# Patient Record
Sex: Female | Born: 1960 | Race: Black or African American | Hispanic: No | Marital: Married | Smoking: Never smoker
Health system: Southern US, Community
[De-identification: ages and names within clinical notes are randomized; demographics above are authoritative.]

## PROBLEM LIST (undated history)

## (undated) DIAGNOSIS — IMO0002 Reserved for concepts with insufficient information to code with codable children: Secondary | ICD-10-CM

## (undated) DIAGNOSIS — E119 Type 2 diabetes mellitus without complications: Secondary | ICD-10-CM

## (undated) DIAGNOSIS — D649 Anemia, unspecified: Secondary | ICD-10-CM

## (undated) DIAGNOSIS — K219 Gastro-esophageal reflux disease without esophagitis: Secondary | ICD-10-CM

## (undated) DIAGNOSIS — E7800 Pure hypercholesterolemia, unspecified: Secondary | ICD-10-CM

## (undated) HISTORY — DX: Reserved for concepts with insufficient information to code with codable children: IMO0002

## (undated) HISTORY — DX: Type 2 diabetes mellitus without complications: E11.9

## (undated) HISTORY — PX: ABDOMINAL HYSTERECTOMY: SHX81

## (undated) HISTORY — PX: TUBAL LIGATION: SHX77

---

## 2000-04-05 ENCOUNTER — Other Ambulatory Visit: Admission: RE | Admit: 2000-04-05 | Discharge: 2000-04-05 | Payer: Self-pay | Admitting: Obstetrics and Gynecology

## 2002-04-08 ENCOUNTER — Other Ambulatory Visit: Admission: RE | Admit: 2002-04-08 | Discharge: 2002-04-08 | Payer: Self-pay | Admitting: *Deleted

## 2002-07-12 ENCOUNTER — Encounter: Admission: RE | Admit: 2002-07-12 | Discharge: 2002-07-12 | Payer: Self-pay | Admitting: Obstetrics and Gynecology

## 2002-08-07 ENCOUNTER — Inpatient Hospital Stay (HOSPITAL_COMMUNITY): Admission: AD | Admit: 2002-08-07 | Discharge: 2002-08-07 | Payer: Self-pay | Admitting: *Deleted

## 2002-08-20 ENCOUNTER — Inpatient Hospital Stay (HOSPITAL_COMMUNITY): Admission: AD | Admit: 2002-08-20 | Discharge: 2002-08-23 | Payer: Self-pay | Admitting: *Deleted

## 2002-08-20 ENCOUNTER — Encounter (INDEPENDENT_AMBULATORY_CARE_PROVIDER_SITE_OTHER): Payer: Self-pay | Admitting: *Deleted

## 2002-08-25 ENCOUNTER — Encounter: Admission: RE | Admit: 2002-08-25 | Discharge: 2002-09-24 | Payer: Self-pay | Admitting: *Deleted

## 2002-10-04 ENCOUNTER — Other Ambulatory Visit: Admission: RE | Admit: 2002-10-04 | Discharge: 2002-10-04 | Payer: Self-pay | Admitting: *Deleted

## 2002-10-24 HISTORY — PX: HAND SURGERY: SHX662

## 2003-11-11 ENCOUNTER — Other Ambulatory Visit: Admission: RE | Admit: 2003-11-11 | Discharge: 2003-11-11 | Payer: Self-pay | Admitting: Obstetrics and Gynecology

## 2004-01-14 ENCOUNTER — Ambulatory Visit (HOSPITAL_COMMUNITY): Admission: RE | Admit: 2004-01-14 | Discharge: 2004-01-14 | Payer: Self-pay | Admitting: Orthopedic Surgery

## 2004-01-14 ENCOUNTER — Ambulatory Visit (HOSPITAL_BASED_OUTPATIENT_CLINIC_OR_DEPARTMENT_OTHER): Admission: RE | Admit: 2004-01-14 | Discharge: 2004-01-14 | Payer: Self-pay | Admitting: Orthopedic Surgery

## 2004-05-06 ENCOUNTER — Ambulatory Visit (HOSPITAL_BASED_OUTPATIENT_CLINIC_OR_DEPARTMENT_OTHER): Admission: RE | Admit: 2004-05-06 | Discharge: 2004-05-06 | Payer: Self-pay | Admitting: Orthopedic Surgery

## 2004-12-07 ENCOUNTER — Other Ambulatory Visit: Admission: RE | Admit: 2004-12-07 | Discharge: 2004-12-07 | Payer: Self-pay | Admitting: Obstetrics and Gynecology

## 2005-01-17 ENCOUNTER — Ambulatory Visit (HOSPITAL_COMMUNITY): Admission: RE | Admit: 2005-01-17 | Discharge: 2005-01-17 | Payer: Self-pay | Admitting: Family Medicine

## 2006-01-17 ENCOUNTER — Other Ambulatory Visit: Admission: RE | Admit: 2006-01-17 | Discharge: 2006-01-17 | Payer: Self-pay | Admitting: Obstetrics and Gynecology

## 2006-02-16 ENCOUNTER — Inpatient Hospital Stay (HOSPITAL_COMMUNITY): Admission: RE | Admit: 2006-02-16 | Discharge: 2006-02-18 | Payer: Self-pay | Admitting: Obstetrics and Gynecology

## 2006-02-16 ENCOUNTER — Encounter (INDEPENDENT_AMBULATORY_CARE_PROVIDER_SITE_OTHER): Payer: Self-pay | Admitting: *Deleted

## 2006-04-07 ENCOUNTER — Encounter: Admission: RE | Admit: 2006-04-07 | Discharge: 2006-04-07 | Payer: Self-pay | Admitting: Obstetrics and Gynecology

## 2006-12-04 ENCOUNTER — Encounter: Admission: RE | Admit: 2006-12-04 | Discharge: 2006-12-04 | Payer: Self-pay | Admitting: Obstetrics and Gynecology

## 2007-12-10 ENCOUNTER — Encounter: Admission: RE | Admit: 2007-12-10 | Discharge: 2007-12-10 | Payer: Self-pay | Admitting: Obstetrics and Gynecology

## 2008-12-15 ENCOUNTER — Encounter: Admission: RE | Admit: 2008-12-15 | Discharge: 2008-12-15 | Payer: Self-pay | Admitting: Obstetrics and Gynecology

## 2008-12-18 ENCOUNTER — Encounter: Admission: RE | Admit: 2008-12-18 | Discharge: 2008-12-18 | Payer: Self-pay | Admitting: Obstetrics and Gynecology

## 2009-12-16 ENCOUNTER — Encounter: Admission: RE | Admit: 2009-12-16 | Discharge: 2009-12-16 | Payer: Self-pay | Admitting: Obstetrics and Gynecology

## 2010-11-14 ENCOUNTER — Encounter: Payer: Self-pay | Admitting: Obstetrics and Gynecology

## 2011-01-24 ENCOUNTER — Other Ambulatory Visit: Payer: Self-pay | Admitting: Obstetrics and Gynecology

## 2011-01-24 DIAGNOSIS — Z1231 Encounter for screening mammogram for malignant neoplasm of breast: Secondary | ICD-10-CM

## 2011-01-28 ENCOUNTER — Ambulatory Visit
Admission: RE | Admit: 2011-01-28 | Discharge: 2011-01-28 | Disposition: A | Discharge status: Home / Self Care | Payer: Commercial Indemnity | Source: Ambulatory Visit | Attending: Obstetrics and Gynecology | Admitting: Obstetrics and Gynecology

## 2011-01-28 DIAGNOSIS — Z1231 Encounter for screening mammogram for malignant neoplasm of breast: Secondary | ICD-10-CM

## 2011-03-11 NOTE — Op Note (Signed)
NAME:  Lisa Baldwin, Lisa Baldwin                     ACCOUNT NO.:  [id number]   MEDICAL RECORD NO.:  [id number]                   PATIENT TYPE:  INP   LOCATION:  9198                                 FACILITY:  [facility]   PHYSICIAN:  Tracie Harrier, M.D.              DATE OF BIRTH:  [birth year]   DATE OF PROCEDURE:  08/20/2002  DATE OF DISCHARGE:                                 OPERATIVE REPORT   PREOPERATIVE DIAGNOSES:  1. Intrauterine pregnancy at 36+ weeks.  2. Repeat cesarean section.  3. Gestational diabetes.  4. Voluntary sterilization.  5. Mature fetal pulmonary lung status.   POSTOPERATIVE DIAGNOSES:  1. Intrauterine pregnancy at 36+ weeks.  2. Repeat cesarean section.  3. Gestational diabetes.  4. Voluntary sterilization.  5. Mature fetal pulmonary lung status.   PROCEDURE:  1. Repeat low transverse cesarean section.  2. Bilateral tubal ligation.   SURGEON:  Tracie Harrier, M.D.   ANESTHESIA:  Spinal.   ESTIMATED BLOOD LOSS:  1000 cc.   COMPLICATIONS:  None.   FINDINGS:  At time of cesarean section a female infant was delivered from the  vertex presentation at 87.  Apgars were at 9 and 9.  The baby was a female  weighing 7 pounds 7 ounces.  The baby did well.   There was a great deal of scar tissue at time of surgery most notable  between anterior surface of the uterus and the anterior abdominal wall.  Otherwise, the ovaries and tubes were normal bilaterally.   PROCEDURE:  The patient was taken to the operating room where a spinal  anesthetic was administered.  The patient was placed on the operating table  in the left lateral tilt position.  The abdomen was prepped and draped in  the usual sterile fashion with Betadine and sterile drapes.  Next, the  abdomen was entered through a Pfannenstiel incision and carried down sharply  in the usual fashion.  Prior to this a Foley catheter was inserted under  sterile conditions.  The abdomen was entered atraumatically.  The  peritoneum  once again atraumatically entered.  Scar tissue was encountered and this was  lysed with blunt and sharp dissection.  A bladder flap was bluntly and  sharply created over the lower uterine segment and a bladder blade placed  behind the bladder to insure its protection during the procedure.  Next,  after the uterus was entered through a low transverse incision and carried  out laterally using the operator's fingers, the membranes were entered with  clear fluid noted.  The vertex was elevated into the incision and delivered  promptly and easily at 0745.  There was a questionable occult cord prolapse  and nuchal cord x1 reduced.  The oro and nasopharynx were thoroughly bulb  suctioned and the cord doubly clamped and cut.  The baby handed promptly to  the pediatricians.   The baby did well.  The placenta was then  extracted intact with three vessel  cord without difficulty.  The anterior of the uterus was wiped clean  thoroughly with a wet sponge.  The uterine incision was closed in a two  layer fashion, the first layer running interlocking suture of number 1  Vicryl suture.  A second embrocating suture was placed across the primary  suture line with a running stitch of number 1 Vicryl as well.  There was  bleeding noted in the left periphery of the incision and several figure-of-  eight sutures were necessary to insure hemostasis in this area.  Care was  taken to avoid urological structures.  Hemostasis was finally achieved.  Bilateral tubal ligation was performed.  First the right tube was identified  and traced to its fimbriated end to insure its positive identification.  The  tube was then grasped in its mid portion about 3 cm from the uterine fundus  and through an avascular region in the mesosalpinx two strands of 0 plain  suture were passed and an approximate 3 cm segment of tube was tied off  between the two 0 plain ligatures.  An approximate 2 cm segment of tube was  then  excised between the two existing ligatures.  A single suture of 0 silk  was placed on the medial tubal stump.  The same procedure was repeated upon  the right tube.  The pelvis was thoroughly irrigated.  There was a great  deal of oozing from the peritoneal surfaces, most notably from where I lysed  the adhesions.  Due to this a Blake drain was placed subfascially.  The  rectus muscle and anterior peritoneum was closed with two interrupted  sutures of number 1 Vicryl.  The fascia was then closed with two sutures of  number 1 Vicryl in a running fashion.  The subcutaneous tissue was irrigated  and made hemostatic using Bovie cautery.  The skin reapproximated with  staples and a sterile dressing applied.  The Blake drain was sewn in with 0  silk.   Final sponge, needle, and instrument count was correct x3.  There were no  further complications except for pelvic adhesions.                                               Tracie Harrier, M.D.    REG/MEDQ  D:  08/20/2002  T:  08/20/2002  Job:  [id number]

## 2011-03-11 NOTE — H&P (Signed)
NAME:  ROBERT, Lisa Baldwin                     ACCOUNT NO.:  [id number]   MEDICAL RECORD NO.:  [id number]                   PATIENT TYPE:  INP   LOCATION:  9122                                 [facility]   PHYSICIAN:  Tracie Harrier, M.D.              DATE OF BIRTH:  1961-01-12   DATE OF ADMISSION:  08/20/2002  DATE OF DISCHARGE:                                HISTORY & PHYSICAL   HISTORY OF PRESENT ILLNESS:  The patient is a 50 year old female gravida 2,  para 1 at 36 and 3/[redacted] weeks gestation.  The patient underwent amniocentesis  yesterday for fetal lung maturity.  This was mature.  She is now admitted  for repeat cesarean section and bilateral tubal ligation.  The patient's  pregnancy was complicated by advanced maternal age.  The patient initially  wanted amniocentesis, but later declined this.  She has been treated for  gestational diabetes throughout her pregnancy and as late has been placed on  insulin.  Amniocentesis on October 27 showed an LS ratio of 9.9:1 with PG.   The patient also requested bilateral tubal ligation with cesarean delivery.   PAST MEDICAL HISTORY:  None.   PAST SURGICAL HISTORY:  1. History of cryo therapy.  2. Cesarean section 1998.  3. Wisdom tooth extraction.   CURRENT MEDICATIONS:  Prenatal vitamins and insulin.   ALLERGIES:  None known.   OB HISTORY:  February 1998 a female at 7 and 6/7 weeks weighing 6 pounds 15  ounces.  The pregnancy was complicated by [facility] and gestational diabetes.   OB LABORATORIES:  Maternal blood type O+, rubella immune, Glucola markedly  elevated, group B Strep unknown.   PHYSICAL EXAMINATION:  VITAL SIGNS:  Stable.  Afebrile.  Fetal heart tones  are present.  GENERAL:  She is a well-developed, well-nourished female in no acute  distress.  HEENT:  Within normal limits.  NECK:  Supple without adenopathy or thyromegaly.  HEART:  Clear.  LUNGS:  Clear.  BREASTS:  Deferred.  ABDOMEN:  Gravid and nontender.  EXTREMITIES:  Grossly normal.  NEUROLOGIC:  Grossly normal.  PELVIC:  Deferred.   ADMITTING DIAGNOSES:  1. Intrauterine pregnancy at 36+ weeks.  2. Mature pulmonary fetal lung status.  3. Repeat cesarean section.  4. Voluntary sterilization.  5. Insulin-dependent gestational diabetes.   PLAN:  1. Repeat low transverse cesarean section.  2. Bilateral tubal ligation.   Discussion of the risks and benefits of this procedure discussed with  patient.  Note, a tubal failure rate of 1 in 200 reviewed with her.                                               Tracie Harrier, M.D.    REG/MEDQ  D:  08/20/2002  Baldwin:  08/20/2002  Job:  [id number]  

## 2011-03-11 NOTE — Op Note (Signed)
NAME:  Lisa Baldwin, Lisa Baldwin                     ACCOUNT NO.:  [id number]   MEDICAL RECORD NO.:  [id number]                   PATIENT TYPE:  AMB   LOCATION:  DSC                                  FACILITY:  [facility]   PHYSICIAN:  Cindee Salt, M.D.                    DATE OF BIRTH:  20-Jan-1961   DATE OF PROCEDURE:  05/06/2004  DATE OF DISCHARGE:                                 OPERATIVE REPORT   PREOPERATIVE DIAGNOSIS:  Carpal tunnel syndrome, left hand.   POSTOPERATIVE DIAGNOSIS:  Carpal tunnel syndrome, left hand.   OPERATION:  Decompression of left median nerve.   SURGEON:  Cindee Salt, M.D.   ASSISTANTCarolyne Fiscal.   ANESTHESIA:  Forearm-based IV regional.   HISTORY:  The patient is a 50 year old female with a history of carpal  tunnel syndrome, EMG and nerve conductions positive.  This has not responded  to conservative treatment.   PROCEDURE:  The patient was brought to the operating room where a forearm-  based IV regional anesthetic was carried out without difficulty.  She was  prepped using Duraprep in supine position, left arm free.  A longitudinal  incision was made in the palm and carried down through subcutaneous tissue.  Bleeders were electrocauterized.  The palmar fascia was split, superficial  palmar arch identified, the flexor tendon to the ring and little finger  identified.  To the ulnar side of the median nerve, the carpal retinaculum  was incised with sharp dissection.  A right-angle and Sewall retractor were  placed between skin and forearm fascia.  The fascia was released for  approximately a centimeter and a half proximal to the wrist crease under  direct vision.  Canal was explored.  Tenosynovial tissue was moderately  thickened.  The wound was irrigated.  Skin was closed with interrupted 5-0  nylon sutures.  A sterile compressive dressing and splint were applied.  The  patient tolerated the procedure well and was taken to the recovery room for  observation in  satisfactory condition.   She is discharged home to return to the [facility] [place] in 1 weeks  on Vicodin.                                               Cindee Salt, M.D.    GK/MEDQ  D:  05/06/2004  T:  05/06/2004  Job:  [id number]

## 2011-03-11 NOTE — Discharge Summary (Signed)
Lisa Baldwin, VENNING NO.:  [id number]   MEDICAL RECORD NO.:  [id number]          PATIENT TYPE:  INP   LOCATION:  9303                          FACILITY:  [facility]   PHYSICIAN:  Guy Sandifer. Henderson Cloud, M.D. DATE OF BIRTH:  Sep 10, 1961   DATE OF ADMISSION:  02/16/2006  DATE OF DISCHARGE:  02/18/2006                                 DISCHARGE SUMMARY   ADMITTING DIAGNOSES:  1.  Menorrhagia.  2.  Dysmenorrhea.   DISCHARGE DIAGNOSES:  1.  Menorrhagia.  2.  Dysmenorrhea.   PROCEDURE:  On February 16, 2006, total abdominal hysterectomy and laparoscopy.   REASON FOR ADMISSION:  The patient is a 50 year old married black female G2,  P2 status post tubal ligation with increasingly heavy painful menses.  Details dictated in the history and physical.  She is admitted for surgical  management.   HOSPITAL COURSE:  The patient is admitted to the hospital and taken to the  operating room where she undergoes laparoscopy followed by total abdominal  hysterectomy.  On the evening of surgery she has good pain relief with a  clear urine output, stable vital signs, and she is afebrile.  On the first  postoperative day she is ambulating but not yet passing flatus.  Hemoglobin  is 10.2, white count 10.4, and pathology is pending.  On the day of  discharge she is tolerating regular diet well with good pain management.  Vital signs are stable.  She remains afebrile.  Incision is healing well.  Staples are removed and Steri-Strips are applied.   CONDITION ON DISCHARGE:  Good.   DIET:  Regular as tolerated.   ACTIVITY:  No lifting, no operation of automobiles, no vaginal entry.   INSTRUCTIONS:  She is to call the office for problems including, but not  limited to, heavy vaginal bleeding, increasing pain, persistent  nausea/vomiting or redness or swelling of the incision.   MEDICATIONS:  1.  Percocet 5/225 mg #40 one to two p.o. q.6 h p.r.n.  2.  Multivitamin daily.  3.  Ibuprofen 600 mg q.6  h p.r.n.   FOLLOW UP:  Followup is in the office in 2 weeks.      Guy Sandifer Henderson Cloud, M.D.  Electronically Signed     JET/MEDQ  D:  02/18/2006  T:  02/19/2006  Job:  [id number]

## 2011-03-11 NOTE — H&P (Signed)
NAME:  Lisa Baldwin, Lisa Baldwin NO.:  [id number]   MEDICAL RECORD NO.:  [id number]          PATIENT TYPE:  AMB   LOCATION:  SDC                           [facility]   PHYSICIAN:  Guy Sandifer. Henderson Cloud, M.D. DATE OF BIRTH:  02-04-1961   DATE OF ADMISSION:  02/16/2006  DATE OF DISCHARGE:                                HISTORY & PHYSICAL   CHIEF COMPLAINT:  Heavy menses.   HISTORY OF PRESENT ILLNESS:  This patient is a 50 year old married black  female G2, P2 status post tubal ligation with increasingly bad premenstrual  pain.  She also is having heavier flows for two to three days a month,  changing a pad plus a tampon every two  hours.  There is also increased  clotting. Ultrasound on January 30, 2006, reveals the uterus measuring 9.2 x 5  x 6.6 cm.  Sonohystogram is negative for intracavitary masses.  Ovaries  appear normal.  After discussion of the options, she is being admitted for  laparoscopically assisted vaginal hysterectomy  and removal of one or both  ovaries if distinctly abnormal.  Potential risks and complications have been  reviewed with the patient preoperatively.   MEDICATIONS:  Allegra and Rhinocort nasal spray p.r.n.   ALLERGIES:  NO KNOWN DRUG ALLERGIES.   PAST MEDICAL HISTORY:  Negative.   PAST SURGICAL HISTORY:  1.  Wisdom tooth extraction.  2.  Cryotherapy for cervical dysplasia.  3.  Surgery for bilateral carpal tunnel syndrome.   OBSTETRIC HISTORY:  Cesarean section x2.   FAMILY HISTORY:  Positive for varicose veins in mother, kidney failure in  nephew, unknown type of cancer in maternal grandmother, leukemia in maternal  aunt, unknown type of cancer in maternal uncle, CVA in paternal grandfather  and maternal aunt.   SOCIAL HISTORY:  Denies tobacco, alcohol or drug abuse.   REVIEW OF SYSTEMS:  NEUROLOGIC:  Denies headache.  CARDIOVASCULAR:  Denies  chest pain.  PULMONARY:  Denies shortness of breath.  GI:  Denies recent  changes in bowel  habits.   PHYSICAL EXAMINATION:  VITAL SIGNS:  Height 4 feet 11 inches.  Weight 137  pounds.  Blood pressure 104/78.  HEENT:  Without thyromegaly.  LUNGS:  Clear to auscultation.  CARDIOVASCULAR:  Regular rate and rhythm.  BACK:  Without CVA tenderness.  BREASTS:  Without masses, retraction or discharge.  ABDOMEN:  Soft, nontender without masses.  PELVIC:  Vulva, vagina and cervix without lesion.  Uterus is anteverted, 6  to 8 weeks in size, mobile, nontender. Adnexa nontender without palpable  masses.  EXTREMITIES:  Grossly within normal limits.  NEUROLOGIC:  Grossly within normal limits.   ASSESSMENT:  Menorrhagia, dysmenorrhea.   PLAN:  Laparoscopically assisted vaginal hysterectomy  and removal of one or  both ovaries if abnormal.      Guy Sandifer. Henderson Cloud, M.D.  Electronically Signed     JET/MEDQ  D:  02/13/2006  T:  02/13/2006  Job:  [id number]

## 2011-03-11 NOTE — Op Note (Signed)
NAME:  ANDREINA, OUTTEN NO.:  [id number]   MEDICAL RECORD NO.:  [id number]                   PATIENT TYPE:  AMB   LOCATION:                                       FACILITY:  [facility]   PHYSICIAN:  Cindee Salt, M.D.                    DATE OF BIRTH:  Jul 23, 1961   DATE OF PROCEDURE:  01/14/2004  DATE OF DISCHARGE:                                 OPERATIVE REPORT   ASSISTANT:  __________   PREOPERATIVE DIAGNOSIS:  Carpal tunnel syndrome, right hand.   POSTOPERATIVE DIAGNOSIS:  Carpal tunnel syndrome, right hand.   OPERATION:  Decompression of right median nerve.   ANESTHESIA:  IV regional.   HISTORY:  The patient is a 50 year old female with a history of carpal  tunnel syndrome.  EMG nerve conduction study was positive which has not  responded to conservative treatment.   DESCRIPTION OF PROCEDURE:  The patient was brought to the operating room  where forearm based IV regional anesthetic was carried out without  difficulty.  She was prepped using DuraPrep in the supine position, right  arm free.  A longitudinal incision was made in the palm and carried down  through the subcutaneous tissue.  Bleeders were electrocauterized.  The  palmaris fascia was split.  Superficial palmar arch identified.  Flexor  tendon of the right middle finger identified to the ulnar side of the median  nerve.  The carpal retinaculum was incised with sharp dissection.  A right  angled and Sewall retractor were placed between the skin and forearm fascia.  The fascia was released to approximately 2 cm proximal to the wrist and  placed under direct vision.  The canal was explored.  The tenosynovial  tissue was thickened and deformity was present to the nerve with a opposite  median artery which was not thrombosed.  The wound was irrigated.  The skin  was closed with interrupted 5-0 nylon sutures.  A sterile compressive  dressing and splint was applied.  The patient tolerated the  procedure well  and was taken to the recovery room for observation in satisfactory  condition.  She is discharged home to return to the office in one week's  time.                                               Cindee Salt, M.D.    Angelique Blonder  D:  01/14/2004  T:  01/15/2004  Job:  [id number]

## 2011-03-11 NOTE — Discharge Summary (Signed)
NAME:  Lisa Baldwin, Lisa Baldwin                     ACCOUNT NO.:  [id number]   MEDICAL RECORD NO.:  [id number]                   PATIENT TYPE:  INP   LOCATION:  9122                                 [facility]   PHYSICIAN:  Julio Sicks, N.P.                  DATE OF BIRTH:  [birth year]   DATE OF ADMISSION:  08/20/2002  DATE OF DISCHARGE:  08/23/2002                                 DISCHARGE SUMMARY   ADMISSION DIAGNOSES:  1. Intrauterine pregnancy at 36+ weeks, estimated gestational age.  2. Repeat Cesarean section.  3. Insulin dependent gestational diabetes mellitus.  4. Voluntary sterilization.  5. Mature fetal pulmonary lung status.   DISCHARGE DIAGNOSES:  1. Status post Cesarean delivery.  2. Viable female infant.  3. Permanent sterilization.   PROCEDURE:  1. Repeat low transverse Cesarean section.  2. Bilateral tubal ligation.   REASON FOR ADMISSION:  Please see dictated history and physical.   HISTORY OF PRESENT ILLNESS:  The patient is a 50 year old African-American  married female, Gravida II, Para I, that was admitted to [facility] at 25 and 3/7th weeks estimated gestation age versus scheduled  Cesarean section. The patient had previously had a Cesarean delivery with  her first child. Amniocentesis was performed on the previous day which  revealed an L/S radio of 9.9 to 1 with PG. The pregnancy had been  complicated by gestational diabetes mellitus, which had required insulin  administration. The patient also requested a bilateral tubal ligation.   HOSPITAL COURSE:  The patient was taken to the OR where a low transverse  incision was made with the delivery of a viable female infant weighing 7  pounds, 7 ounces, with Apgar's of 9 at one minute and 9 at five minutes.  Bilateral tubal ligation was performed. The patient was noted to have  extensive adhesions. There was a great deal of oozing from the peritoneal  surfaces after lysis of adhesions,  therefore, Blake drain was placed. The  patient tolerated the procedure well and was taken to the recovery room in  stable condition. On postoperative day one, temperature was noted to be  slightly elevated to 99. Vital signs were stable. Abdomen was slightly  distended with good bowel sounds. The Blake drain was noted to have 50 cc of  drainage. Abdominal dressing was clean, dry, and intact. Labs revealed  hemoglobin of 9.3 with platelet count of 123,00 and WBC count 8.3. IV was  discontinued. On postoperative day two, abdomen was continued to have slight  distention. The patient was ambulating slowly. She was tolerating a regular  diet without complaints of nausea and vomiting. Abdominal dressing was  removed and incision was noted to be clean, dry, and intact. Blake drain was  removed. Fasting blood sugar was 75 mg per deciliter. Dulcolax suppository  was administered. On postoperative day three, incision was clean, dry, and  intact. Staples were removed. Fasting blood  sugar was 110. Two hour post  prandial was 122 to 200.   DISPOSITION:  The patient was discharged to home.   CONDITION ON DISCHARGE:  Stable.   DIET:  ADA diet.   ACTIVITY:  No heavy lifting, no driving times two weeks, and no vaginal  entry.   FOLLOW UP:  The patient is to follow-up in the office in one to two weeks  for an incision check. She is to continue with blood sugar record, which she  is to bring to the office. She is to call for temperature greater than 100  degrees, persistent nausea and vomiting, heavy vaginal bleeding, and/or  redness and drainage from the incisional site.   DISCHARGE MEDICATIONS:  1. Percocet 5/325 #30 one by mouth every four to six hours as needed pain.  2. Motrin 600 mg one every six hours as needed.  3. Prenatal vitamins one by mouth daily.  4. Colace one by mouth daily as needed.                                               Julio Sicks, N.P.    CC/MEDQ  D:  09/15/2002   T:  09/16/2002  Job:  [id number]   cc:   Dineen Kid. Rana Snare, M.D.  [address]  [place]  [place] [address]  Fax: [phone]

## 2011-03-11 NOTE — Op Note (Signed)
NAME:  Lisa Baldwin, Lisa Baldwin NO.:  [id number]   MEDICAL RECORD NO.:  [id number]          PATIENT TYPE:  AMB   LOCATION:  SDC                           FACILITY:  [facility]   PHYSICIAN:  Guy Sandifer. Henderson Cloud, M.D. DATE OF BIRTH:  November 27, 1960   DATE OF PROCEDURE:  02/16/2006  DATE OF DISCHARGE:                                 OPERATIVE REPORT   PREOPERATIVE DIAGNOSIS:  1.  Menorrhagia  2.  Dysmenorrhea.   POSTOPERATIVE DIAGNOSIS:  1.  Menorrhagia  2.  Dysmenorrhea.   PROCEDURE:  Total abdominal hysterectomy and laparoscopy.   SURGEON:  Guy Sandifer. Henderson Cloud, M.D.   ASSISTANT:  Zelphia Cairo, M.D.   ANESTHESIA:  General with endotracheal intubation.   ESTIMATED BLOOD LOSS:  350 mL   SPECIMENS:  Uterus.   INDICATIONS AND CONSENT:  The patient is a 50 year old married black female,  G2, P2, status post tubal ligation with increasingly painful and heavy  menses.  Details are dictated history and physical.  Laparoscopically  assisted vaginal hysterectomy with bilateral salpingo-oophorectomy in the  event of abnormal ovaries is discussed.  The potential risks and  complications were discussed preoperatively including but limited to  infection, bowel, bladder, ureteral damage, bleeding requiring transfusion  of blood products with possible transfusion reaction, HIV and hepatitis  acquisition, DVT, PE, pneumonia, postoperative dyspareunia, laparotomy.  All  questions were answered and consent is signed on the chart.   FINDINGS:  At laparoscopy, upper abdomen is grossly normal.  There are a  line of omental adhesions along the top of the previous Pfannenstiel scar.  The uterine fundus is extremely densely adherent to the anterior abdominal  wall.  The ovaries are normal bilaterally.  Posterior cul-de-sac is normal.   PROCEDURE:  The patient is taken to the operating room where she is  identified, placed in the dorsal supine position, and general anesthesia was  induced via  endotracheal intubation.  She is then placed in dorsal lithotomy  position where she is prepped abdominally and vaginally, bladder straight  catheterized.  Hulka tenaculum was placed in the uterus as a manipulator and  she is draped in a sterile fashion.  The infraumbilical and suprapubic areas  were infiltrated 0.5% plain Marcaine in the midline.  A small infraumbilical  incision is made and a disposable Veress needle was placed.  A normal  syringe and drop test are noted.  2 liters of gas were insufflated under low  pressure with good tympany in the right upper quadrant.  The Veress needle  was then removed.  The 10/11 Excel bladeless disposable trocar sleeve was  then placed using direct visualization with the diagnostic laparoscopic.  After placement, it is replaced with the operative laparoscopic.  The above  findings were noted.  After noting the omental adhesions are well clear of  any bowel, the omentum is taken down from its point of adhesion to the  anterior abdominal wall with the Gyrus bipolar cautery cutting instrument.  This reveals the uterus to be extremely densely adherent to the anterior  abdominal wall with no plane for dissection.  The decision is then made  to  proceed to laparotomy.   The laparoscope was withdrawn.  A Foley catheter was placed in the bladder.  The skin is then entered through a Pfannenstiel incision and dissection is  carried out in layers to the peritoneum which is taken down and extended  superiorly and inferiorly.  The O'Connor-O'Sullivan retractor was placed,  the bowel was packed away, and the upper blade was placed.  The uterus was  then dissected free from the anterior abdominal wall using a combination of  sharp and blunt dissection.  The urine remains clear throughout this  process.  Good urine output is noted.  The uterus is, thereby, freed.  The  left round ligament is then taken down and ligated with 0 Monocryl.  All  suture will be 0  Monocryl unless otherwise designated.  The anterior leaf of  the broad ligament has already been in essence taken down with the above  dissection.  The posterior leaf is bluntly perforated and the utero-ovarian  ligament is taken down and doubly ligated with a stitch and a free tie.  After assuring the bladder is at taken down well, the left uterine vessel  was then taken down and singly ligated.  Additional bites were taken with  the straight clamp to advance the cardinal ligament.  A similar procedure  was carried out on the right hand side.  Then, using progressive bites with  a curved clamp, the uterus with the cervix is removed.  The cervix is  removed in total.  The cuff was closed with figure-of-eights.  There is a  bleeder on the bladder flap which was controlled with 2-0 chromic suture.  Gelfoam was placed on the bladder flap.  The ovaries were suspended from the  round ligaments bilaterally.  All packs and instruments are then removed  with the retractors.  The anterior peritoneum was closed in a running  fashion with 0 Monocryl suture.  The anterior rectus fascia is closed in a  running fashion with 0 PDS suture.  The skin was closed with clips.  The  umbilical incision was closed with Dermabond.  All counts were correct.  The  patient is awakened and taken to the recovery room in stable condition.      Guy Sandifer Henderson Cloud, M.D.  Electronically Signed     JET/MEDQ  D:  02/16/2006  T:  02/16/2006  Job:  [id number]

## 2012-01-18 ENCOUNTER — Other Ambulatory Visit: Payer: Self-pay | Admitting: Obstetrics and Gynecology

## 2012-01-18 DIAGNOSIS — Z1231 Encounter for screening mammogram for malignant neoplasm of breast: Secondary | ICD-10-CM

## 2012-02-06 ENCOUNTER — Ambulatory Visit
Admission: RE | Admit: 2012-02-06 | Discharge: 2012-02-06 | Disposition: A | Discharge status: Home / Self Care | Payer: 59 | Source: Ambulatory Visit | Attending: Obstetrics and Gynecology | Admitting: Obstetrics and Gynecology

## 2012-02-06 DIAGNOSIS — Z1231 Encounter for screening mammogram for malignant neoplasm of breast: Secondary | ICD-10-CM

## 2012-07-31 ENCOUNTER — Other Ambulatory Visit (HOSPITAL_COMMUNITY): Payer: Self-pay | Admitting: Obstetrics and Gynecology

## 2012-07-31 DIAGNOSIS — K6289 Other specified diseases of anus and rectum: Secondary | ICD-10-CM

## 2012-07-31 DIAGNOSIS — R102 Pelvic and perineal pain: Secondary | ICD-10-CM

## 2012-07-31 DIAGNOSIS — N83202 Unspecified ovarian cyst, left side: Secondary | ICD-10-CM

## 2012-08-02 ENCOUNTER — Ambulatory Visit (HOSPITAL_COMMUNITY): Payer: 59

## 2013-01-11 ENCOUNTER — Other Ambulatory Visit: Payer: Self-pay

## 2013-01-11 DIAGNOSIS — Z1231 Encounter for screening mammogram for malignant neoplasm of breast: Secondary | ICD-10-CM

## 2013-02-08 ENCOUNTER — Ambulatory Visit
Admission: RE | Admit: 2013-02-08 | Discharge: 2013-02-08 | Disposition: A | Discharge status: Home / Self Care | Payer: 59 | Source: Ambulatory Visit

## 2013-02-08 DIAGNOSIS — Z1231 Encounter for screening mammogram for malignant neoplasm of breast: Secondary | ICD-10-CM

## 2013-06-11 ENCOUNTER — Other Ambulatory Visit: Payer: Self-pay | Admitting: Family Medicine

## 2013-06-11 DIAGNOSIS — R1032 Left lower quadrant pain: Secondary | ICD-10-CM

## 2013-06-12 ENCOUNTER — Ambulatory Visit
Admission: RE | Admit: 2013-06-12 | Discharge: 2013-06-12 | Disposition: A | Discharge status: Home / Self Care | Payer: 59 | Source: Ambulatory Visit | Attending: Family Medicine | Admitting: Family Medicine

## 2013-06-12 DIAGNOSIS — R1032 Left lower quadrant pain: Secondary | ICD-10-CM

## 2013-06-14 ENCOUNTER — Other Ambulatory Visit: Payer: Self-pay | Admitting: Obstetrics and Gynecology

## 2013-06-14 DIAGNOSIS — D4959 Neoplasm of unspecified behavior of other genitourinary organ: Secondary | ICD-10-CM

## 2013-06-19 ENCOUNTER — Ambulatory Visit
Admission: RE | Admit: 2013-06-19 | Discharge: 2013-06-19 | Disposition: A | Discharge status: Home / Self Care | Payer: 59 | Source: Ambulatory Visit | Attending: Obstetrics and Gynecology | Admitting: Obstetrics and Gynecology

## 2013-06-19 DIAGNOSIS — D4959 Neoplasm of unspecified behavior of other genitourinary organ: Secondary | ICD-10-CM

## 2013-06-19 MED ORDER — IOHEXOL 300 MG/ML  SOLN
100.0000 mL | Freq: Once | INTRAMUSCULAR | Status: AC | PRN
  Administered 2013-06-19: 100 mL via INTRAVENOUS

## 2013-06-26 ENCOUNTER — Encounter: Payer: Self-pay | Admitting: Gynecologic Oncology

## 2013-06-27 ENCOUNTER — Ambulatory Visit: Payer: 59 | Attending: Gynecologic Oncology | Admitting: Gynecologic Oncology

## 2013-06-27 ENCOUNTER — Encounter: Payer: Self-pay | Admitting: Gynecologic Oncology

## 2013-06-27 VITALS — BP 106/70 | HR 98 | Temp 99.0°F | Resp 16 | Ht 59.06 in | Wt 143.7 lb

## 2013-06-27 DIAGNOSIS — Z90710 Acquired absence of both cervix and uterus: Secondary | ICD-10-CM | POA: Insufficient documentation

## 2013-06-27 DIAGNOSIS — N83209 Unspecified ovarian cyst, unspecified side: Secondary | ICD-10-CM | POA: Insufficient documentation

## 2013-06-27 DIAGNOSIS — E119 Type 2 diabetes mellitus without complications: Secondary | ICD-10-CM | POA: Insufficient documentation

## 2013-06-27 DIAGNOSIS — Z79899 Other long term (current) drug therapy: Secondary | ICD-10-CM | POA: Insufficient documentation

## 2013-06-27 NOTE — Patient Instructions (Signed)
Unilateral Salpingo-Oophorectomy Unilateral salpingo-oophorectomy is the removal of one fallopian tube and ovary. The fallopian tubes transport the egg from the ovary to the womb (uterus). The fallopian tube is also where the sperm and egg meet and become fertilized and move down into the uterus.  Removing one tube and ovary will not:  Cause problems with your menstrual periods.  Cause problems with your sex drive (libido).  Put you into the menopause.  Give symptoms of the menopause.  Make you not able to get pregnant (sterile). There are several reasons for doing a salpingo-oophorectomy:  Infection of the tube and ovary.  There may be scar tissue of the tube and ovary (adhesions).  It may be necessary to remove the tube when the ovary has a cyst or tumor.  It may have to be done when removing the uterus.  It may have to be done when there is cancer of the tube or ovary. LET YOUR CAREGIVER KNOW ABOUT:  Allergies to food or medications.  All the medications you are taking including prescription and over-the-counter herbs, eye drops and creams.  If you are using illegal drugs or excessive alcohol.  Your smoking habits.  Previous problems with anesthesia including numbing medication.  The possibility of being pregnant.  History of blood clots or bleeding problems.  Previous surgery.  Any other medical or health problems. RISKS AND COMPLICATIONS  All surgery is associated with risks. Some of these risks are:  Injury to surrounding organs.  Bleeding.  Infection.  Blood clots in the legs or lungs.  Problems with the anesthesia.  The surgery does not help the problem.  Death. BEFORE THE PROCEDURE  Do not take aspirin or blood thinners because it can make you bleed.  Do not eat or drink anything at least 8 hours before the surgery.  Let your caregiver know if you develop a cold or an infection.  If you are being admitted the day of surgery, arrive at least  one hour before the surgery.  Arrange for help when you go home from the hospital.  If you smoke, do not smoke for at least 2 weeks before the surgery. PROCEDURE After being admitted to the hospital, you will change into a hospital gown. Then, you will be given an IV (intravenous) and a medication to relax you. Then, you will be put to sleep with an anesthetic. Any hair on your lower belly (abdomen) will be removed, and a catheter will be placed in your bladder. The fallopian tube and ovary will be removed either through 2 very small cuts (incisions) or through large incision in the lower abdomen. The blood vessels will be clamped and tied. AFTER THE PROCEDURE  You will be taken to the recovery room for 1 to 3 hours until your blood pressure, pulse and temperature are stable and you are waking up.  If you had a laparoscopy, you may be discharged in several hours.  If you had a large incision, you will be admitted to the hospital for a day or two.  If you had a laparoscopy, you may have shoulder pain. This is not unusual. It is from air that is left in the abdomen and affects the nerve that goes from the diaphragm to the shoulder. It goes away in a day or two.  You will be given pain medication as necessary.  The intravenous and catheter will be removed before you are discharged.  Have someone available to take you home. HOME CARE INSTRUCTIONS  It is normal to be sore for a week or two. Call your caregiver if the pain is getting worse or the pain medication is not helping.  Have help when you go home for a week or so to help with the household chores.  Follow your caregiver's advice regarding diet.  Get rest and sleep.  Only take over-the-counter or prescription medicines for pain or discomfort as directed by your caregiver.  Do not take aspirin. It can cause bleeding.  Do not drive, exercise or lift anything over 5 pounds.  Do not drink alcohol until your caregiver gives you  permission.  Do not lift anything over 5 pounds.  Do not have sexual intercourse until your caregiver says it is OK.  Take your temperature twice a day and write it down.  Change the bandage (dressing) as directed.  Make and keep your follow-up appointments for postoperative care.  If you become constipated, ask your caregiver about taking a mild laxative. Drinking more liquids than usual and eating bran foods can help prevent constipation. SEEK MEDICAL CARE IF:   You have swelling or redness around the cut (incision).  You develop a rash.  You have side effects from the medication.  You feel lightheaded.  You need more or stronger medication.  You have pain, swelling or redness where the IV (intravenous) was placed. SEEK IMMEDIATE MEDICAL CARE IF:   You develop an unexplained temperature above 100 F (37.8 C).  You develop increasing belly (abdominal) pain.  You have pus coming out of the incision.  You notice a bad smell coming from the wound or dressing.  The incision is separating.  There is excessive vaginal bleeding.  You start to feel sick to your stomach (nauseous) and vomit.  You have leg or chest pain.  You have pain when you urinate.  You develop shortness of breath.  You pass out. Document Released: 08/07/2009 Document Revised: 01/02/2012 Document Reviewed: 08/07/2009 [facility] Patient Information 2014 [place], [place].

## 2013-06-27 NOTE — Progress Notes (Signed)
Consult Note: Gyn-Onc  Lisa Baldwin 51 y.o. female  CC:  Chief Complaint  Patient presents with  . Ovarian Cyst    New Consult    HPI: Patient is seen today in consultation at the quest of Dr. Jim Tomblin.  Patient is a very pleasant 51-year-old gravida 2 para 2 who underwent hysterectomy 2007 secondary to uterine fibroids and pelvic pain. This was done via a Pfannenstiel skin incision by Dr. Tomblin. 2-3 weeks ago she began experiencing some left lower abdominal and side pain. She went to see her primary physician. An ultrasound August 20 revealed a 6.3 x 5.7 x 6.1 cm cystic and solid ovarian mass. She was seen by him on August 21. At that time there was a fullness and mild tenderness in the left adnexa. She underwent a CT scan on 06/19/2013 that revealed the liver, spleen, and pancreas to be normal. The appendix was normal. There is no free fluid or adenopathy within the abdomen or pelvis. The right ovary was identified and measured 3 x 2 cm it was normal. However, the left ovary measured 5.2 x 2.9 x 3.7 cm and there was a suggestion of a 3 cm mildly hypoattenuating complex mass within the left ovary. A CA 125 was drawn that was normal at 7.5. She is referred to us today for evaluation.  She's really not had any significant discomfort since that time. She denies any nausea or vomiting. She denies any change in her bowel or bladder habits. Her weight has been stable. She denies any fevers, chills, chest pain, shortness of breath, early satiety. She has stopped exercising secondary to the pain if her that would occur. She is a diabetic. Her last A1c was normal. Her blood sugars run in the 108 117 range. She has not had any vasomotor symptoms yet she has not had any night sweats. She's up-to-date on her mammograms. She's not yet started colon cancer screening via colonoscopy.  Interval History:  As above  Review of Systems:  Constitutional:  Denies fever. Skin: No rash, sores, jaundice,  itching, or dryness.  Cardiovascular: No chest pain, shortness of breath, or edema  Pulmonary: No cough or wheeze.  Gastro Intestinal: Reporting intermittent lower abdominal soreness.  No nausea, vomiting, constipation, or diarrhea reported. No bright red blood per rectum or change in bowel movement.  Genitourinary: No frequency, urgency, or dysuria.  Denies vaginal bleeding and discharge.  Musculoskeletal: No myalgia, arthralgia, joint swelling or pain.  Neurologic: No weakness, numbness, or change in gait.  Psychology: No changes.    Current Meds:  Outpatient Encounter Prescriptions as of 06/27/2013  Medication Sig Dispense Refill  . Cetirizine HCl (ZYRTEC PO) Take by mouth as needed.      . Exenatide ER (BYDUREON) 2 MG PEN Inject into the skin.      . lisinopril (PRINIVIL,ZESTRIL) 10 MG tablet Take 10 mg by mouth daily.      . pioglitazone (ACTOS) 15 MG tablet Take 15 mg by mouth daily.      . simvastatin (ZOCOR) 20 MG tablet Take 20 mg by mouth every evening.       No facility-administered encounter medications on file as of 06/27/2013.    Allergy: No Known Allergies  Social Hx:   History   Social History  . Marital Status: Married    Spouse Name: N/A    Number of Children: N/A  . Years of Education: N/A   Occupational History  . Not on file.   Social   History Main Topics  . Smoking status: Never Smoker   . Smokeless tobacco: Not on file  . Alcohol Use: No  . Drug Use: No  . Sexual Activity: Yes   Other Topics Concern  . Not on file   Social History Narrative  . No narrative on file    Past Surgical Hx:  Past Surgical History  Procedure Laterality Date  . Tubal ligation    . Abdominal hysterectomy    . Hand surgery  2004    Carpal Tunnel  (Bilateral)  . Cesarean section      Past Medical Hx:  Past Medical History  Diagnosis Date  . Abnormal Pap smear   . Diabetes mellitus without complication   . Carpal tunnel syndrome, bilateral     Oncology Hx:    No history exists.    Family Hx:  Family History  Problem Relation Age of Onset  . Cancer Sister 53  . Cancer Maternal Grandmother 77  . Cancer - Other Maternal Aunt 77  . Cancer - Other Maternal Uncle 49  . Cancer - Prostate Maternal Uncle 82  . Cancer - Lung Maternal Uncle 85    Vitals:  Blood pressure 106/70, pulse 98, temperature 99 F (37.2 C), temperature source Oral, resp. rate 16, height 4' 11.06" (1.5 m), weight 143 lb 11.2 oz (65.182 kg).  Physical Exam:  Well-nourished well-developed female in no acute distress.  Neck: Supple, no lymphadenopathy, no thyromegaly.  Lungs: Clear to auscultation bilaterally.  Cardiovascular: Regular rate and rhythm.  Abdomen: Well-healed right upper quadrant incision. Infraumbilical incision and Pfannenstiel incision is x3. Abdomen is soft, nontender, nondistended. There is no fluid wave. There is no hepatosplenomegaly. There is no obvious masses.  Groins: No lymphadenopathy.  Extremities: No edema.  Pelvic: External genitalia within normal limits. Bimanual examination reveals a midline abdominal pelvic fullness. There is no obvious masses appreciated. There is no nodularity.  Assessment/Plan:  51-year-old with a complex left adnexal mass, reassuring CT and a normal CA 125. Her symptoms most likely with portion that occur 2-3 weeks ago with intermittent symptoms since that time. We discussed her symptoms and all agree that we need to proceed with left salpingo-oophorectomy. She understands the mass to be sent for frozen section. If the frozen section is benign she would like to have a right salpingectomy in retain her right ovary. If the mass is a malignancy she understands that we'll proceed with a bilateral salpingo-oophorectomy and appropriate staging. We discussed the route of surgery and I believe that she is a candidate for a minimally invasive surgery. We will schedule her for a robotic left salpingo-oophorectomy. She understands  that if we cannot proceed robotic we secondary to adhesive disease if the mass appears benign we may proceed via Pfannenstiel incision versions says a vertical midline incision.  Versus surgery including but not limited to bleeding, infection, injury to surrounding organs and thromboembolic disease were discussed. Her surgery we'll tentatively scheduled for next Tuesday on September 9 with Dr. Wendy Brewster. She understands that she'll the opportunity to meet Dr. Brewster on the morning of her surgery.  Her questions as well as those of her mother who accompanied her today were elicited in answer to their satisfaction. She was given or carcinoma call us with any questions prior to her visit. Jamai Dolce A., MD 06/27/2013, 2:26 PM  

## 2013-06-28 ENCOUNTER — Encounter (HOSPITAL_COMMUNITY)
Admission: RE | Admit: 2013-06-28 | Discharge: 2013-06-28 | Disposition: A | Discharge status: Home / Self Care | Payer: 59 | Source: Ambulatory Visit | Attending: Gynecologic Oncology | Admitting: Gynecologic Oncology

## 2013-06-28 ENCOUNTER — Encounter (HOSPITAL_COMMUNITY): Payer: Self-pay

## 2013-06-28 DIAGNOSIS — Z01812 Encounter for preprocedural laboratory examination: Secondary | ICD-10-CM | POA: Insufficient documentation

## 2013-06-28 DIAGNOSIS — Z01810 Encounter for preprocedural cardiovascular examination: Secondary | ICD-10-CM | POA: Insufficient documentation

## 2013-06-28 HISTORY — DX: Pure hypercholesterolemia, unspecified: E78.00

## 2013-06-28 HISTORY — DX: Gastro-esophageal reflux disease without esophagitis: K21.9

## 2013-06-28 HISTORY — DX: Anemia, unspecified: D64.9

## 2013-06-28 LAB — BASIC METABOLIC PANEL
BUN: 10 mg/dL (ref 6–23)
Creatinine, Ser: 0.54 mg/dL (ref 0.50–1.10)
GFR calc Af Amer: >90 mL/min (ref >90)
GFR calc non Af Amer: >90 mL/min (ref >90)
Glucose, Bld: 102 mg/dL — ABNORMAL HIGH (ref 70–99)

## 2013-06-28 LAB — CBC
MCV: 91.7 fL (ref 78.0–100.0)
Platelets: 319 10*3/uL (ref 150–400)
RBC: 4.12 MIL/uL (ref 3.87–5.11)
WBC: 4.1 10*3/uL (ref 4.0–10.5)

## 2013-06-28 NOTE — Patient Instructions (Signed)
22 Lisa Baldwin  06/28/2013   Your procedure is scheduled on: 07/02/13  Report to [facility] at 5:15 AM.  Call this number if you have problems the morning of surgery 336-: [phone]   Remember:   Do not eat food or drink liquids After Midnight.     Take these medicines the morning of surgery with A SIP OF WATER: zyrtec, simvastatin   Do not wear jewelry, make-up or nail polish.  Do not wear lotions, powders, or perfumes. You may wear deodorant.  Do not shave 48 hours prior to surgery. Men may shave face and neck.  Do not bring valuables to the hospital.  Contacts, dentures or bridgework may not be worn into surgery.     Patients discharged the day of surgery will not be allowed to drive home.  Name and phone number of your driver: Lisa Baldwin [phone]   Please read over the following fact sheets that you were given: blood fact sheet [name], RN  pre op nurse call if needed [phone]    FAILURE TO FOLLOW THESE INSTRUCTIONS MAY RESULT IN CANCELLATION OF YOUR SURGERY   Patient Signature: ___________________________________________

## 2013-06-28 NOTE — Progress Notes (Signed)
Please put in surgery orders for pt. Pt having surgery 07/02/13.

## 2013-07-01 ENCOUNTER — Encounter (HOSPITAL_COMMUNITY): Payer: Self-pay | Admitting: Pharmacy Technician

## 2013-07-01 NOTE — Anesthesia Preprocedure Evaluation (Addendum)
Anesthesia Evaluation  Patient identified by MRN, date of birth, ID band Patient awake    Reviewed: Allergy & Precautions, H&P , NPO status , Patient's Chart, lab work & pertinent test results  Airway Mallampati: III TM Distance: >3 FB Neck ROM: Full    Dental  (+) Teeth Intact, Dental Advisory Given and Chipped   Pulmonary neg pulmonary ROS,  breath sounds clear to auscultation  Pulmonary exam normal       Cardiovascular negative cardio ROS  Rhythm:Regular Rate:Normal     Neuro/Psych negative neurological ROS  negative psych ROS   GI/Hepatic Neg liver ROS, GERD-  Medicated,  Endo/Other  diabetes, Type 1, Oral Hypoglycemic Agents  Renal/GU negative Renal ROS  negative genitourinary   Musculoskeletal negative musculoskeletal ROS (+)   Abdominal   Peds  Hematology negative hematology ROS (+)   Anesthesia Other Findings   Reproductive/Obstetrics negative OB ROS                         Anesthesia Physical Anesthesia Plan  ASA: II  Anesthesia Plan: General   Post-op Pain Management:    Induction: Intravenous  Airway Management Planned: Oral ETT  Additional Equipment:   Intra-op Plan:   Post-operative Plan: Extubation in OR  Informed Consent: I have reviewed the patients History and Physical, chart, labs and discussed the procedure including the risks, benefits and alternatives for the proposed anesthesia with the patient or authorized representative who has indicated his/her understanding and acceptance.   Dental advisory given  Plan Discussed with: CRNA  Anesthesia Plan Comments:         Anesthesia Quick Evaluation

## 2013-07-02 ENCOUNTER — Encounter (HOSPITAL_COMMUNITY)
Admission: RE | Disposition: A | Discharge status: Home / Self Care | Payer: Self-pay | Source: Ambulatory Visit | Attending: Obstetrics & Gynecology

## 2013-07-02 ENCOUNTER — Ambulatory Visit (HOSPITAL_COMMUNITY): Payer: 59 | Admitting: Anesthesiology

## 2013-07-02 ENCOUNTER — Encounter (HOSPITAL_COMMUNITY): Payer: Self-pay | Admitting: Anesthesiology

## 2013-07-02 ENCOUNTER — Encounter (HOSPITAL_COMMUNITY): Payer: Self-pay

## 2013-07-02 ENCOUNTER — Inpatient Hospital Stay (HOSPITAL_COMMUNITY)
Admission: RE | Admit: 2013-07-02 | Discharge: 2013-07-04 | DRG: 743 | Disposition: A | Discharge status: Home / Self Care | Payer: 59 | Source: Ambulatory Visit | Attending: Obstetrics & Gynecology | Admitting: Obstetrics & Gynecology

## 2013-07-02 DIAGNOSIS — I10 Essential (primary) hypertension: Secondary | ICD-10-CM | POA: Diagnosis present

## 2013-07-02 DIAGNOSIS — K660 Peritoneal adhesions (postprocedural) (postinfection): Secondary | ICD-10-CM | POA: Diagnosis present

## 2013-07-02 DIAGNOSIS — D279 Benign neoplasm of unspecified ovary: Secondary | ICD-10-CM

## 2013-07-02 DIAGNOSIS — N736 Female pelvic peritoneal adhesions (postinfective): Secondary | ICD-10-CM | POA: Diagnosis present

## 2013-07-02 DIAGNOSIS — N9489 Other specified conditions associated with female genital organs and menstrual cycle: Principal | ICD-10-CM | POA: Diagnosis present

## 2013-07-02 DIAGNOSIS — E119 Type 2 diabetes mellitus without complications: Secondary | ICD-10-CM | POA: Diagnosis present

## 2013-07-02 DIAGNOSIS — N83209 Unspecified ovarian cyst, unspecified side: Secondary | ICD-10-CM | POA: Diagnosis present

## 2013-07-02 DIAGNOSIS — Z5331 Laparoscopic surgical procedure converted to open procedure: Secondary | ICD-10-CM

## 2013-07-02 HISTORY — PX: ROBOTIC ASSISTED SALPINGO OOPHERECTOMY: SHX6082

## 2013-07-02 LAB — URINALYSIS, ROUTINE W REFLEX MICROSCOPIC
Bilirubin Urine: NEGATIVE
Hgb urine dipstick: NEGATIVE
Ketones, ur: NEGATIVE mg/dL
Nitrite: NEGATIVE
Urobilinogen, UA: 1 mg/dL (ref 0.0–1.0)
pH: 6.5 (ref 5.0–8.0)

## 2013-07-02 LAB — GLUCOSE, CAPILLARY
Glucose-Capillary: 191 mg/dL — ABNORMAL HIGH (ref 70–99)
Glucose-Capillary: 149 mg/dL — ABNORMAL HIGH (ref 70–99)

## 2013-07-02 LAB — TYPE AND SCREEN

## 2013-07-02 LAB — ABO/RH: ABO/RH(D): O POS

## 2013-07-02 SURGERY — ROBOTIC ASSISTED SALPINGO OOPHORECTOMY
Anesthesia: General | Wound class: Clean Contaminated

## 2013-07-02 MED ORDER — BUPIVACAINE LIPOSOME 1.3 % IJ SUSP
INTRAMUSCULAR | Status: DC | PRN
  Administered 2013-07-02: 20 mL

## 2013-07-02 MED ORDER — NEOSTIGMINE METHYLSULFATE 1 MG/ML IJ SOLN
INTRAMUSCULAR | Status: DC | PRN
  Administered 2013-07-02: 3 mg via INTRAVENOUS

## 2013-07-02 MED ORDER — OXYCODONE HCL 5 MG PO TABS
5.0000 mg | ORAL_TABLET | ORAL | Status: DC | PRN
  Administered 2013-07-02 – 2013-07-03 (×2): 5 mg via ORAL
  Filled 2013-07-02 (×2): qty 1

## 2013-07-02 MED ORDER — INSULIN GLARGINE 100 UNIT/ML ~~LOC~~ SOLN
10.0000 [IU] | Freq: Every day | SUBCUTANEOUS | Status: DC
  Administered 2013-07-02 – 2013-07-03 (×2): 10 [IU] via SUBCUTANEOUS
  Filled 2013-07-02 (×3): qty 0.1

## 2013-07-02 MED ORDER — ZOLPIDEM TARTRATE 5 MG PO TABS: 5.0000 mg | ORAL_TABLET | Freq: Every evening | ORAL | Status: DC | PRN

## 2013-07-02 MED ORDER — MIDAZOLAM HCL 5 MG/5ML IJ SOLN
INTRAMUSCULAR | Status: DC | PRN
  Administered 2013-07-02: 2 mg via INTRAVENOUS

## 2013-07-02 MED ORDER — SODIUM CHLORIDE 0.9 % IJ SOLN
INTRAMUSCULAR | Status: AC
  Filled 2013-07-02: qty 20

## 2013-07-02 MED ORDER — 0.9 % SODIUM CHLORIDE (POUR BTL) OPTIME
TOPICAL | Status: DC | PRN
  Administered 2013-07-02: 1000 mL

## 2013-07-02 MED ORDER — STERILE WATER FOR IRRIGATION IR SOLN
Status: DC | PRN
  Administered 2013-07-02: 3000 mL

## 2013-07-02 MED ORDER — GLUCERNA PO LIQD: 237.0000 mL | Freq: Two times a day (BID) | ORAL | Status: DC

## 2013-07-02 MED ORDER — CEFAZOLIN SODIUM-DEXTROSE 2-3 GM-% IV SOLR
INTRAVENOUS | Status: AC
  Filled 2013-07-02: qty 50

## 2013-07-02 MED ORDER — CEFAZOLIN SODIUM-DEXTROSE 2-3 GM-% IV SOLR
2.0000 g | INTRAVENOUS | Status: AC
  Administered 2013-07-02: 2 g via INTRAVENOUS

## 2013-07-02 MED ORDER — KCL IN DEXTROSE-NACL 20-5-0.45 MEQ/L-%-% IV SOLN
INTRAVENOUS | Status: DC
  Administered 2013-07-02: 14:00:00 via INTRAVENOUS
  Filled 2013-07-02 (×2): qty 1000

## 2013-07-02 MED ORDER — ONDANSETRON HCL 4 MG/2ML IJ SOLN
INTRAMUSCULAR | Status: DC | PRN
  Administered 2013-07-02: 4 mg via INTRAVENOUS

## 2013-07-02 MED ORDER — HYDROMORPHONE HCL PF 1 MG/ML IJ SOLN: 0.2500 mg | INTRAMUSCULAR | Status: DC | PRN

## 2013-07-02 MED ORDER — LACTATED RINGERS IV SOLN
INTRAVENOUS | Status: DC | PRN
  Administered 2013-07-02 (×3): via INTRAVENOUS

## 2013-07-02 MED ORDER — LIDOCAINE HCL (CARDIAC) 20 MG/ML IV SOLN
INTRAVENOUS | Status: DC | PRN
  Administered 2013-07-02: 70 mg via INTRAVENOUS

## 2013-07-02 MED ORDER — INSULIN ASPART 100 UNIT/ML ~~LOC~~ SOLN
0.0000 [IU] | SUBCUTANEOUS | Status: DC
  Administered 2013-07-02 (×2): 3 [IU] via SUBCUTANEOUS
  Administered 2013-07-03: 2 [IU] via SUBCUTANEOUS
  Administered 2013-07-03: 3 [IU] via SUBCUTANEOUS
  Administered 2013-07-03: 2 [IU] via SUBCUTANEOUS

## 2013-07-02 MED ORDER — LACTATED RINGERS IV SOLN: INTRAVENOUS | Status: DC

## 2013-07-02 MED ORDER — HEPARIN SODIUM (PORCINE) 1000 UNIT/ML IJ SOLN
INTRAMUSCULAR | Status: AC
  Filled 2013-07-02: qty 1

## 2013-07-02 MED ORDER — ONDANSETRON HCL 4 MG PO TABS: 4.0000 mg | ORAL_TABLET | Freq: Four times a day (QID) | ORAL | Status: DC | PRN

## 2013-07-02 MED ORDER — SUCCINYLCHOLINE CHLORIDE 20 MG/ML IJ SOLN
INTRAMUSCULAR | Status: DC | PRN
  Administered 2013-07-02: 100 mg via INTRAVENOUS

## 2013-07-02 MED ORDER — MAGNESIUM HYDROXIDE 400 MG/5ML PO SUSP
30.0000 mL | Freq: Three times a day (TID) | ORAL | Status: AC
  Administered 2013-07-02 – 2013-07-03 (×3): 30 mL via ORAL
  Filled 2013-07-02 (×3): qty 30

## 2013-07-02 MED ORDER — DEXAMETHASONE SODIUM PHOSPHATE 10 MG/ML IJ SOLN
INTRAMUSCULAR | Status: DC | PRN
  Administered 2013-07-02: 10 mg via INTRAVENOUS

## 2013-07-02 MED ORDER — GLUCERNA SHAKE PO LIQD
237.0000 mL | Freq: Two times a day (BID) | ORAL | Status: DC
  Administered 2013-07-02 – 2013-07-04 (×2): 237 mL via ORAL
  Filled 2013-07-02 (×5): qty 237

## 2013-07-02 MED ORDER — GLYCOPYRROLATE 0.2 MG/ML IJ SOLN
INTRAMUSCULAR | Status: DC | PRN
  Administered 2013-07-02: 0.4 mg via INTRAVENOUS

## 2013-07-02 MED ORDER — LISINOPRIL 5 MG PO TABS
5.0000 mg | ORAL_TABLET | Freq: Every morning | ORAL | Status: DC
  Administered 2013-07-02 – 2013-07-04 (×3): 5 mg via ORAL
  Filled 2013-07-02 (×3): qty 1

## 2013-07-02 MED ORDER — HYDROMORPHONE HCL PF 1 MG/ML IJ SOLN
0.5000 mg | INTRAMUSCULAR | Status: DC | PRN
  Administered 2013-07-02 – 2013-07-03 (×3): 0.5 mg via INTRAVENOUS
  Filled 2013-07-02 (×3): qty 1

## 2013-07-02 MED ORDER — SIMVASTATIN 20 MG PO TABS
20.0000 mg | ORAL_TABLET | Freq: Every day | ORAL | Status: DC
  Administered 2013-07-02 – 2013-07-03 (×2): 20 mg via ORAL
  Filled 2013-07-02 (×3): qty 1

## 2013-07-02 MED ORDER — LACTATED RINGERS IR SOLN
Status: DC | PRN
  Administered 2013-07-02: 1000 mL

## 2013-07-02 MED ORDER — TRAMADOL HCL 50 MG PO TABS
100.0000 mg | ORAL_TABLET | Freq: Four times a day (QID) | ORAL | Status: DC
  Administered 2013-07-02 – 2013-07-04 (×9): 100 mg via ORAL
  Filled 2013-07-02 (×9): qty 2

## 2013-07-02 MED ORDER — ROCURONIUM BROMIDE 100 MG/10ML IV SOLN
INTRAVENOUS | Status: DC | PRN
  Administered 2013-07-02: 5 mg via INTRAVENOUS
  Administered 2013-07-02: 40 mg via INTRAVENOUS
  Administered 2013-07-02: 10 mg via INTRAVENOUS
  Administered 2013-07-02: 5 mg via INTRAVENOUS

## 2013-07-02 MED ORDER — OXYCODONE-ACETAMINOPHEN 5-325 MG PO TABS
1.0000 | ORAL_TABLET | ORAL | Status: DC | PRN
Start: 2013-07-02 — End: 2013-07-02

## 2013-07-02 MED ORDER — BUPIVACAINE LIPOSOME 1.3 % IJ SUSP
20.0000 mL | Freq: Once | INTRAMUSCULAR | Status: DC
  Filled 2013-07-02: qty 20

## 2013-07-02 MED ORDER — PROPOFOL 10 MG/ML IV BOLUS
INTRAVENOUS | Status: DC | PRN
  Administered 2013-07-02: 50 mg via INTRAVENOUS
  Administered 2013-07-02: 150 mg via INTRAVENOUS
  Administered 2013-07-02: 50 mg via INTRAVENOUS

## 2013-07-02 MED ORDER — ONDANSETRON HCL 4 MG/2ML IJ SOLN: 4.0000 mg | Freq: Four times a day (QID) | INTRAMUSCULAR | Status: DC | PRN

## 2013-07-02 MED ORDER — ACETAMINOPHEN 500 MG PO TABS
1000.0000 mg | ORAL_TABLET | Freq: Four times a day (QID) | ORAL | Status: DC
  Administered 2013-07-02 – 2013-07-04 (×8): 1000 mg via ORAL
  Filled 2013-07-02 (×15): qty 2

## 2013-07-02 MED ORDER — PROMETHAZINE HCL 25 MG/ML IJ SOLN: 6.2500 mg | INTRAMUSCULAR | Status: DC | PRN

## 2013-07-02 MED ORDER — SUFENTANIL CITRATE 50 MCG/ML IV SOLN
INTRAVENOUS | Status: DC | PRN
  Administered 2013-07-02: 10 ug via INTRAVENOUS
  Administered 2013-07-02 (×6): 5 ug via INTRAVENOUS
  Administered 2013-07-02 (×4): 10 ug via INTRAVENOUS

## 2013-07-02 MED ORDER — HYDROMORPHONE HCL PF 1 MG/ML IJ SOLN
INTRAMUSCULAR | Status: DC | PRN
  Administered 2013-07-02 (×2): 1 mg via INTRAVENOUS

## 2013-07-02 MED ORDER — OXYCODONE HCL 5 MG PO TABS: 5.0000 mg | ORAL_TABLET | ORAL | Status: DC | PRN

## 2013-07-02 MED ORDER — PIOGLITAZONE HCL 15 MG PO TABS
15.0000 mg | ORAL_TABLET | Freq: Every morning | ORAL | Status: DC
  Administered 2013-07-02 – 2013-07-04 (×3): 15 mg via ORAL
  Filled 2013-07-02 (×4): qty 1

## 2013-07-02 SURGICAL SUPPLY — 60 items
BENZOIN TINCTURE PRP APPL 2/3 (GAUZE/BANDAGES/DRESSINGS) ×2 IMPLANT
BLADE EXTENDED COATED 6.5IN (ELECTRODE) ×2 IMPLANT
CHLORAPREP W/TINT 26ML (MISCELLANEOUS) ×2 IMPLANT
CLOTH BEACON ORANGE TIMEOUT ST (SAFETY) ×2 IMPLANT
CONT SPEC 4OZ CLIKSEAL STRL BL (MISCELLANEOUS) ×2 IMPLANT
CORDS BIPOLAR (ELECTRODE) ×2 IMPLANT
COVER SURGICAL LIGHT HANDLE (MISCELLANEOUS) ×2 IMPLANT
COVER TIP SHEARS 8 DVNC (MISCELLANEOUS) ×1 IMPLANT
COVER TIP SHEARS 8MM DA VINCI (MISCELLANEOUS) ×1
DECANTER SPIKE VIAL GLASS SM (MISCELLANEOUS) IMPLANT
DRAPE SURG IRRIG POUCH 19X23 (DRAPES) ×2 IMPLANT
DRAPE UTILITY XL STRL (DRAPES) ×2 IMPLANT
DRAPE WARM FLUID 44X44 (DRAPE) ×2 IMPLANT
DRSG TEGADERM 2-3/8X2-3/4 SM (GAUZE/BANDAGES/DRESSINGS) IMPLANT
DRSG TEGADERM 4X4.75 (GAUZE/BANDAGES/DRESSINGS) IMPLANT
DRSG TEGADERM 6X8 (GAUZE/BANDAGES/DRESSINGS) ×4 IMPLANT
ELECT REM PT RETURN 9FT ADLT (ELECTROSURGICAL) ×2
ELECTRODE REM PT RTRN 9FT ADLT (ELECTROSURGICAL) ×1 IMPLANT
GAUZE SPONGE 2X2 8PLY STRL LF (GAUZE/BANDAGES/DRESSINGS) IMPLANT
GAUZE VASELINE 3X9 (GAUZE/BANDAGES/DRESSINGS) IMPLANT
GLOVE BIO SURGEON STRL SZ 6.5 (GLOVE) ×4 IMPLANT
GLOVE BIO SURGEON STRL SZ7.5 (GLOVE) ×4 IMPLANT
GLOVE BIOGEL PI IND STRL 7.0 (GLOVE) ×2 IMPLANT
GLOVE BIOGEL PI INDICATOR 7.0 (GLOVE) ×2
GOWN PREVENTION PLUS XLARGE (GOWN DISPOSABLE) ×6 IMPLANT
HOLDER FOLEY CATH W/STRAP (MISCELLANEOUS) IMPLANT
KIT ACCESSORY DA VINCI DISP (KITS) ×1
KIT ACCESSORY DVNC DISP (KITS) ×1 IMPLANT
MANIPULATOR UTERINE 4.5 ZUMI (MISCELLANEOUS) IMPLANT
NEEDLE HYPO 22GX1.5 SAFETY (NEEDLE) ×4 IMPLANT
OCCLUDER COLPOPNEUMO (BALLOONS) IMPLANT
PEN SKIN MARKING BROAD (MISCELLANEOUS) ×2 IMPLANT
PENCIL BUTTON HOLSTER BLD 10FT (ELECTRODE) ×2 IMPLANT
POUCH SPECIMEN RETRIEVAL 10MM (ENDOMECHANICALS) IMPLANT
RETRACTOR WND ALEXIS 25 LRG (MISCELLANEOUS) ×1 IMPLANT
RTRCTR WOUND ALEXIS 25CM LRG (MISCELLANEOUS) ×2
SET TUBE IRRIG SUCTION NO TIP (IRRIGATION / IRRIGATOR) ×2 IMPLANT
SHEET LAVH (DRAPES) ×2 IMPLANT
SOLUTION ELECTROLUBE (MISCELLANEOUS) ×2 IMPLANT
SPONGE GAUZE 2X2 STER 10/PKG (GAUZE/BANDAGES/DRESSINGS)
SPONGE GAUZE 4X4 12PLY (GAUZE/BANDAGES/DRESSINGS) ×2 IMPLANT
SPONGE LAP 18X18 X RAY DECT (DISPOSABLE) ×6 IMPLANT
STRIP CLOSURE SKIN 1/2X4 (GAUZE/BANDAGES/DRESSINGS) ×2 IMPLANT
SUT SILK 3 0 SH CR/8 (SUTURE) ×2 IMPLANT
SUT VIC AB 0 CT1 27 (SUTURE) ×4
SUT VIC AB 0 CT1 27XBRD ANTBC (SUTURE) ×4 IMPLANT
SUT VIC AB 2-0 SH 27 (SUTURE) ×3
SUT VIC AB 2-0 SH 27X BRD (SUTURE) ×3 IMPLANT
SUT VIC AB 4-0 PS2 27 (SUTURE) ×8 IMPLANT
SUT VICRYL 0 UR6 27IN ABS (SUTURE) ×4 IMPLANT
SYR BULB IRRIGATION 50ML (SYRINGE) ×2 IMPLANT
TAPE CLOTH SURG 4X10 WHT LF (GAUZE/BANDAGES/DRESSINGS) ×2 IMPLANT
TRAP SPECIMEN MUCOUS 40CC (MISCELLANEOUS) IMPLANT
TRAY LAP CHOLE (CUSTOM PROCEDURE TRAY) ×2 IMPLANT
TROCAR 12M 150ML BLUNT (TROCAR) ×2 IMPLANT
TROCAR BLADELESS OPT 5 100 (ENDOMECHANICALS) ×2 IMPLANT
TROCAR XCEL 12X100 BLDLESS (ENDOMECHANICALS) ×2 IMPLANT
TUBING FILTER THERMOFLATOR (ELECTROSURGICAL) IMPLANT
TUBING INSUFFLATION 10FT LAP (TUBING) ×2 IMPLANT
WATER STERILE IRR 1500ML POUR (IV SOLUTION) IMPLANT

## 2013-07-02 NOTE — Transfer of Care (Signed)
Immediate Anesthesia Transfer of Care Note  Patient: Lisa Baldwin  Procedure(s) Performed: Procedure(s): ROBOTIC ASSISTED LEFT SALPINGO OOPHORECTOMY CONVERTED TO OPEN,AND RIGHT SALPINJECTOMY (N/A)  Patient Location: PACU  Anesthesia Type:General  Level of Consciousness: awake, alert , oriented and patient cooperative  Airway & Oxygen Therapy: Patient Spontanous Breathing and Patient connected to face mask oxygen  Post-op Assessment: Report given to PACU RN, Post -op Vital signs reviewed and stable and Patient moving all extremities X 4  Post vital signs: stable  Complications: No apparent anesthesia complications

## 2013-07-02 NOTE — Progress Notes (Signed)
Patient using an antibiotic eye drop for an infection in right eye from contacts

## 2013-07-02 NOTE — H&P (View-Only) (Signed)
Consult Note: Gyn-Onc  Lovie Chol 52 y.o. female  CC:  Chief Complaint  Patient presents with  . Ovarian Cyst    New Consult    HPI: Patient is seen today in consultation at the quest of Dr. Thressa Sheller.  Patient is a very pleasant 52 year old gravida 2 para 2 who underwent hysterectomy 2007 secondary to uterine fibroids and pelvic pain. This was done via a Pfannenstiel skin incision by Dr. Henderson Cloud. 2-3 weeks ago she began experiencing some left lower abdominal and side pain. She went to see her primary physician. An ultrasound August 20 revealed a 6.3 x 5.7 x 6.1 cm cystic and solid ovarian mass. She was seen by him on August 21. At that time there was a fullness and mild tenderness in the left adnexa. She underwent a CT scan on 06/19/2013 that revealed the liver, spleen, and pancreas to be normal. The appendix was normal. There is no free fluid or adenopathy within the abdomen or pelvis. The right ovary was identified and measured 3 x 2 cm it was normal. However, the left ovary measured 5.2 x 2.9 x 3.7 cm and there was a suggestion of a 3 cm mildly hypoattenuating complex mass within the left ovary. A CA 125 was drawn that was normal at 7.5. She is referred to [place] today for evaluation.  She's really not had any significant discomfort since that time. She denies any nausea or vomiting. She denies any change in her bowel or bladder habits. Her weight has been stable. She denies any fevers, chills, chest pain, shortness of breath, early satiety. She has stopped exercising secondary to the pain if her that would occur. She is a diabetic. Her last A1c was normal. Her blood sugars run in the 108 117 range. She has not had any vasomotor symptoms yet she has not had any night sweats. She's up-to-date on her mammograms. She's not yet started colon cancer screening via colonoscopy.  Interval History:  As above  Review of Systems:  Constitutional:  Denies fever. Skin: No rash, sores, jaundice,  itching, or dryness.  Cardiovascular: No chest pain, shortness of breath, or edema  Pulmonary: No cough or wheeze.  Gastro Intestinal: Reporting intermittent lower abdominal soreness.  No nausea, vomiting, constipation, or diarrhea reported. No bright red blood per rectum or change in bowel movement.  Genitourinary: No frequency, urgency, or dysuria.  Denies vaginal bleeding and discharge.  Musculoskeletal: No myalgia, arthralgia, joint swelling or pain.  Neurologic: No weakness, numbness, or change in gait.  Psychology: No changes.    Current Meds:  Outpatient Encounter Prescriptions as of 06/27/2013  Medication Sig Dispense Refill  . Cetirizine HCl (ZYRTEC PO) Take by mouth as needed.      . Exenatide ER (BYDUREON) 2 MG PEN Inject into the skin.      Marland Kitchen lisinopril (PRINIVIL,ZESTRIL) 10 MG tablet Take 10 mg by mouth daily.      . pioglitazone (ACTOS) 15 MG tablet Take 15 mg by mouth daily.      . simvastatin (ZOCOR) 20 MG tablet Take 20 mg by mouth every evening.       No facility-administered encounter medications on file as of 06/27/2013.    Allergy: No Known Allergies  Social Hx:   History   Social History  . Marital Status: Married    Spouse Name: N/A    Number of Children: N/A  . Years of Education: N/A   Occupational History  . Not on file.   Social  History Main Topics  . Smoking status: Never Smoker   . Smokeless tobacco: Not on file  . Alcohol Use: No  . Drug Use: No  . Sexual Activity: Yes   Other Topics Concern  . Not on file   Social History Narrative  . No narrative on file    Past Surgical Hx:  Past Surgical History  Procedure Laterality Date  . Tubal ligation    . Abdominal hysterectomy    . Hand surgery  2004    Carpal Tunnel  (Bilateral)  . Cesarean section      Past Medical Hx:  Past Medical History  Diagnosis Date  . Abnormal Pap smear   . Diabetes mellitus without complication   . Carpal tunnel syndrome, bilateral     Oncology Hx:    No history exists.    Family Hx:  Family History  Problem Relation Age of Onset  . Cancer Sister 71  . Cancer Maternal Grandmother 43  . Cancer - Other Maternal Aunt 83  . Cancer - Other Maternal Uncle 6  . Cancer - Prostate Maternal Uncle 82  . Cancer - Lung Maternal Uncle 2    Vitals:  Blood pressure 106/70, pulse 98, temperature 99 F (37.2 C), temperature source Oral, resp. rate 16, height 4' 11.06" (1.5 m), weight 143 lb 11.2 oz (65.182 kg).  Physical Exam:  Well-nourished well-developed female in no acute distress.  Neck: Supple, no lymphadenopathy, no thyromegaly.  Lungs: Clear to auscultation bilaterally.  Cardiovascular: Regular rate and rhythm.  Abdomen: Well-healed right upper quadrant incision. Infraumbilical incision and Pfannenstiel incision is x3. Abdomen is soft, nontender, nondistended. There is no fluid wave. There is no hepatosplenomegaly. There is no obvious masses.  Groins: No lymphadenopathy.  Extremities: No edema.  Pelvic: External genitalia within normal limits. Bimanual examination reveals a midline abdominal pelvic fullness. There is no obvious masses appreciated. There is no nodularity.  Assessment/Plan:  52 year old with a complex left adnexal mass, reassuring CT and a normal CA 125. Her symptoms most likely with portion that occur 2-3 weeks ago with intermittent symptoms since that time. We discussed her symptoms and all agree that we need to proceed with left salpingo-oophorectomy. She understands the mass to be sent for frozen section. If the frozen section is benign she would like to have a right salpingectomy in retain her right ovary. If the mass is a malignancy she understands that we'll proceed with a bilateral salpingo-oophorectomy and appropriate staging. We discussed the route of surgery and I believe that she is a candidate for a minimally invasive surgery. We will schedule her for a robotic left salpingo-oophorectomy. She understands  that if we cannot proceed robotic we secondary to adhesive disease if the mass appears benign we may proceed via Pfannenstiel incision versions says a vertical midline incision.  Versus surgery including but not limited to bleeding, infection, injury to surrounding organs and thromboembolic disease were discussed. Her surgery we'll tentatively scheduled for next Tuesday on September 9 with Dr. Laurette Schimke. She understands that she'll the opportunity to meet Dr. Nelly Rout on the morning of her surgery.  Her questions as well as those of her mother who accompanied her today were elicited in answer to their satisfaction. She was given or carcinoma call us with any questions prior to her visit. Kruti Horacek A., MD 06/27/2013, 2:26 PM

## 2013-07-02 NOTE — Interval H&P Note (Signed)
History and Physical Interval Note:  07/02/2013 7:05 AM  Lisa Baldwin  has presented today for surgery, with the diagnosis of OVARIAN CYST  The various methods of treatment have been discussed with the patient and family. After consideration of risks, benefits and other options for treatment, the patient has consented to  Procedure(s): ROBOTIC ASSISTED LEFT SALPINGO OOPHORECTOMY, POSSIBLE RIGHT SALPINJECTOMY, POSSIBLE RIGHT OOPHERECTOMY AND POSSIBLE STAGING (N/A) as a surgical intervention .  The patient's history has been reviewed, patient examined, no change in status, stable for surgery.  I have reviewed the patient's chart and labs.  Questions were answered to the patient's satisfaction.     [place], [facility]

## 2013-07-02 NOTE — Op Note (Signed)
Pre-operative Diagnosis: adnexal mass  Post-operative Diagnosis: benign adnexal mass dense intrabdominal adhesions  Operation: Diagnostic LSC, exploratory laparotome, bilateral salpingectomy, left oophorectomy, extensive adhesiolysis.  Surgeon: Laurette Schimke  Assistant Surgeon: Antionette Char MD  Anesthesia: GET  Findings: Intra-abdominal adhesions with sigmoid and small bowel along the lower anterior abdominal wall at the site of the prior Pfannenstiel incisions.  Small bowel adhesions deep within the pelvis.  Left ovary with a hemorrhagic cyst normal Normal fallopian tubes bilaterally. No evidence of ascites no evidence of malignancy.  Procedure:  After induction of anesthesia, the patient was draped and prepped in the usual sterile manner. Pt was placed in supine position after anesthesia and draped and prepped in the usual sterile manner. The abdominal drape was placed after the CholoraPrep had been allowed to dry for 3 minutes.  Her arms were tucked to her side with all appropriate precautions.  The chest was secured to the operating room table with tape in the usual manner. The patient was placed in the semi-lithotomy position in [place] stirrups.  The perineum was prepped with Betadine.  Foley catheter was placed.  OG tube placement was confirmed and to suction.   An incision was made 2 cm distal to ribs in the midline and under direct visualization an Optiview inserted. Multiple areas of adhesions were appreciated. However trochars were placed at level of the umbilicus and 10 cm lateral to the umbilical incisions bilaterally.  The patient was placed in Trendelenburg and extensive inter abdominal adhesions were appreciated. An attempt was made to dissect the adhesions however many of the adhesions limited visualization with the camera and there appeared to be dense adhesions of the small bowel to the prior Pfannenstiel incision.  A decision was made to proceed with an exploratory  laparotomy.  A Pfannenstiel incision was made at the site of one of the prior incisions. Dissection was taken down to the level of the fascia using cautery. Using sharp dissection the rectus muscles separated from the anterior abdominal wall approximately and distally. The abdomen was entered in the midline sharply. Bowel was noted adherent to the anterior abdominal wall there is evidence of cautery location to the bowel at 2 sites. These areas were oversewn with 3-0 silk in an interrupted manner. Approximately 45 minutes of adhesive lysis was performed. The bladder was dissected from the anterior abdominal wall.With the bowels sufficiently dissected free from the anterior abdominal wall a malleable retractor was placed. Washings were collected The left adnexa was noted adherent to the anterior abdominal wall. The adnexa was sharply dissected off the structure. The infundibulopelvic ligament was identified clamped and transected. The fallopian tube was then dissected off the anterior abdominal and pelvic sidewall. Specimen sent for frozen section.  Right ovary was noted to be within the pelvis and adherent to the sidewall. The right ovary was normal in appearance. The fallopian tube was resected.  Frozen section returned a hemorrhagic left ovarian cyst. The retractor was removed from the abdomen and the abdomen and pelvis irrigated and drained. Hemostasis was assured.  The fascia at the site of the umbilical trocar incision was closed with an interrupted 0 Vicryl. A Pfannenstiel fascial incision was closed with 0 Vicryl suture in a running manner overlapping in the midline. Exparil was injected into all of the incision sites. All incision sites were closed with subcuticular 4-0 Vicryl suture.    Estimated Blood Loss:  less than 50 mL         Specimens: PATHOLOGY  Condition:  Stable              Complications:  None; patient tolerated the procedure well.         Disposition: PACU - hemodynamically  stable.

## 2013-07-02 NOTE — Progress Notes (Signed)
Attempted to reach Warner Mccreedy NP x 2 without response to clarify  orders. Call then into Dr Nelly Rout.

## 2013-07-03 ENCOUNTER — Encounter (HOSPITAL_COMMUNITY): Payer: Self-pay | Admitting: Gynecologic Oncology

## 2013-07-03 DIAGNOSIS — K660 Peritoneal adhesions (postprocedural) (postinfection): Secondary | ICD-10-CM | POA: Diagnosis present

## 2013-07-03 DIAGNOSIS — N9489 Other specified conditions associated with female genital organs and menstrual cycle: Secondary | ICD-10-CM | POA: Diagnosis present

## 2013-07-03 LAB — BASIC METABOLIC PANEL
CO2: 27 mEq/L (ref 19–32)
Chloride: 103 mEq/L (ref 96–112)
GFR calc non Af Amer: >90 mL/min (ref >90)
Glucose, Bld: 135 mg/dL — ABNORMAL HIGH (ref 70–99)
Potassium: 3.8 mEq/L (ref 3.5–5.1)
Sodium: 136 mEq/L (ref 135–145)

## 2013-07-03 LAB — GLUCOSE, CAPILLARY
Glucose-Capillary: 134 mg/dL — ABNORMAL HIGH (ref 70–99)
Glucose-Capillary: 135 mg/dL — ABNORMAL HIGH (ref 70–99)
Glucose-Capillary: 141 mg/dL — ABNORMAL HIGH (ref 70–99)
Glucose-Capillary: 160 mg/dL — ABNORMAL HIGH (ref 70–99)

## 2013-07-03 LAB — CBC
HCT: 30.4 % — ABNORMAL LOW (ref 36.0–46.0)
Hemoglobin: 10.3 g/dL — ABNORMAL LOW (ref 12.0–15.0)
MCH: 30.7 pg (ref 26.0–34.0)
RBC: 3.35 MIL/uL — ABNORMAL LOW (ref 3.87–5.11)

## 2013-07-03 MED ORDER — IBUPROFEN 600 MG PO TABS
600.0000 mg | ORAL_TABLET | Freq: Four times a day (QID) | ORAL | Status: DC
  Administered 2013-07-03 – 2013-07-04 (×6): 600 mg via ORAL
  Filled 2013-07-03 (×8): qty 1

## 2013-07-03 MED ORDER — BISACODYL 10 MG RE SUPP
10.0000 mg | Freq: Once | RECTAL | Status: AC
  Administered 2013-07-03: 10 mg via RECTAL
  Filled 2013-07-03: qty 1

## 2013-07-03 MED ORDER — SILVER NITRATE-POT NITRATE 75-25 % EX MISC
1.0000 "application " | Freq: Once | CUTANEOUS | Status: AC
  Administered 2013-07-03: 1 via TOPICAL
  Filled 2013-07-03: qty 1

## 2013-07-03 MED ORDER — INSULIN ASPART 100 UNIT/ML ~~LOC~~ SOLN
0.0000 [IU] | Freq: Three times a day (TID) | SUBCUTANEOUS | Status: DC
  Administered 2013-07-03: 3 [IU] via SUBCUTANEOUS
  Administered 2013-07-03 – 2013-07-04 (×2): 2 [IU] via SUBCUTANEOUS

## 2013-07-03 NOTE — Progress Notes (Signed)
1 Day Post-Op Procedure(s) (LRB): ROBOTIC ASSISTED LEFT SALPINGO OOPHORECTOMY CONVERTED TO OPEN,AND RIGHT SALPINJECTOMY (N/A)  Subjective: Patient reports doing well this am.  Tolerating solid food with no nausea or emesis reported.  Ambulated yesterday evening with assist.  Adequate pain relief reported at this time.  Denies chest pain, dyspnea, passing flatus, or having a bowel movement.  No concerns voiced.  Objective: Vital signs in last 24 hours: Temp:  [97.9 F (36.6 C)-98.6 F (37 C)] 98.5 F (36.9 C) (09/10 0547) Pulse Rate:  [90-105] 90 (09/10 0547) Resp:  [14-20] 18 (09/10 0547) BP: (87-127)/(44-67) 98/63 mmHg (09/10 0547) SpO2:  [93 %-100 %] 93 % (09/10 0547) Weight:  [147 lb (66.679 kg)] 147 lb (66.679 kg) (09/09 1210) Last BM Date: 07/01/13  Intake/Output from previous day: 09/09 0701 - 09/10 0700 In: 4638.3 [P.O.:960; I.V.:3678.3] Out: 3025 [Urine:2775; Blood:250]  Physical Examination: General: alert, cooperative and no distress Resp: clear to auscultation bilaterally Cardio: regular rate and rhythm, S1, S2 normal, no murmur, click, rub or gallop GI: incision: Large abdominal dressing removed, lap sites with steri strips intact, silver nitrate applied to incision at the umbilicus due to minimal amount of active bleeding after dressing removed, low transverse incision intact with subcutaneous closure, 1/2 inch steri strips applied to the lower incision, all incisions clean and dry and abdomen mildly distended and tympanic, active bowel sounds noted, non-tender Extremities: extremities normal, atraumatic, no cyanosis or edema  Labs: WBC/Hgb/Hct/Plts:  9.6/10.3/30.4/244 (09/10 1610) BUN/Cr/glu/ALT/AST/amyl/lip:  7/0.57/--/--/--/--/-- (09/10 9604)  Assessment: 52 y.o. s/p Procedure(s): ROBOTIC ASSISTED LEFT SALPINGO OOPHORECTOMY CONVERTED TO OPEN,AND RIGHT SALPINJECTOMY: stable Pain:  Pain is well-controlled on PRN medications.  Heme:  Hgb 10.3 and Hct 30.4 this am.   Hgb 12.6 and Hct 37.8 pre-op.  Stable post-operatively.  CV: Hx of hypertension.  Current treatment:  lisinopril (Prinivil).  Continue to monitor.  GI:  Tolerating po: Yes     FEN:  Stable post-operatively.  Endo: Diabetes mellitus Type II, under good control..  CBG:  CBG (last 3)   Recent Labs  07/02/13 1957 07/03/13 0006 07/03/13 0411  GLUCAP 149* 141* 128*    Prophylaxis: intermittent pneumatic compression boots.  Plan: Advance diet to carb modified CBG changed to AC/HS Dulcolax suppository this am Ibuprofen 600 mg Q6H per Dr. Tamela Oddi Encourage ambulation, IS use, deep breathing, and coughing Continue post-operative plan of care per Dr. Tamela Oddi   LOS: 1 day    CROSS, MELISSA DEAL 07/03/2013, 9:28 AM

## 2013-07-03 NOTE — Care Management Note (Signed)
    Page 1 of 1   07/03/2013     11:42:11 AM   CARE MANAGEMENT NOTE 07/03/2013  Patient:  Lisa Baldwin, Lisa Baldwin   Account Number:  [id number]  Date Initiated:  07/03/2013  Documentation initiated by:  Lorenda Ishihara  Subjective/Objective Assessment:   52 yo female admitted s/p exploratory lap, lysis of adhesions, bilateral salpingectomy, left oophorectomy. PTA lived at home with spouse.     Action/Plan:   Home when stable   Anticipated DC Date:  07/05/2013   Anticipated DC Plan:  HOME/SELF CARE      DC Planning Services  CM consult      Choice offered to / List presented to:             Status of service:  Completed, signed off Medicare Important Message given?   (If response is "NO", the following Medicare IM given date fields will be blank) Date Medicare IM given:   Date Additional Medicare IM given:    Discharge Disposition:  HOME/SELF CARE  Per UR Regulation:  Reviewed for med. necessity/level of care/duration of stay  If discussed at Long Length of Stay Meetings, dates discussed:    Comments:

## 2013-07-04 LAB — GLUCOSE, CAPILLARY: Glucose-Capillary: 109 mg/dL — ABNORMAL HIGH (ref 70–99)

## 2013-07-04 MED ORDER — OXYCODONE-ACETAMINOPHEN 5-325 MG PO TABS: 1.0000 | ORAL_TABLET | ORAL | Status: DC | PRN

## 2013-07-04 NOTE — Progress Notes (Signed)
Patient discharged to home with family via wheelchair, discharge instructions reviewed with patient who verbalized understanding, new RX given to patient.

## 2013-07-04 NOTE — Discharge Summary (Signed)
Physician Discharge Summary  Patient ID: VON INSCOE MRN: [id number] DOB/AGE: [birth year] 52 y.o.  Admit date: 07/02/2013 Discharge date: 07/04/2013  Admission Diagnoses: Adnexal mass  Discharge Diagnoses:  Principal Problem:   Adnexal mass Active Problems:   Abdominal adhesions  Discharged Condition:  The patient is in good condition and stable for discharge.    Hospital Course: On 07/02/2013, the patient underwent the following: Procedure(s): ROBOTIC ASSISTED LEFT SALPINGO OOPHORECTOMY CONVERTED TO OPEN,AND RIGHT SALPINJECTOMY.  The postoperative course was uneventful.  She was discharged to home on postoperative day 2 tolerating a regular diet, passing flatus, and having bowel movements.  Consults: None  Significant Diagnostic Studies: None  Treatments: surgery: see above  Discharge Exam: Blood pressure 104/70, pulse 101, temperature 98.7 F (37.1 C), temperature source Oral, resp. rate 18, height 4\' 11"  (1.499 m), weight 147 lb (66.679 kg), SpO2 91.00%. General appearance: alert, cooperative and no distress Resp: clear to auscultation bilaterally Cardio: regular rate and rhythm, S1, S2 normal, no murmur, click, rub or gallop GI: soft, non-tender; bowel sounds normal; no masses,  no organomegaly Extremities: extremities normal, atraumatic, no cyanosis or edema Incision/Wound: Lap sites to the abdomen with steri strips clean, dry, and intact.  Low transverse incision with steri strips clean, dry, and intact. No drainage or erythema.  Disposition: Home  Discharge Orders   Future Appointments Provider Department Dept Phone   07/30/2013 10:30 AM Laurette Schimke, MD [facility] [phone]   Future Orders Complete By Expires   Call MD for:  difficulty breathing, headache or visual disturbances  As directed    Call MD for:  extreme fatigue  As directed    Call MD for:  hives  As directed    Call MD for:  persistant dizziness or  light-headedness  As directed    Call MD for:  persistant nausea and vomiting  As directed    Call MD for:  redness, tenderness, or signs of infection (pain, swelling, redness, odor or green/yellow discharge around incision site)  As directed    Call MD for:  severe uncontrolled pain  As directed    Call MD for:  temperature >100.4  As directed    Diet - low sodium heart healthy  As directed    Driving Restrictions  As directed    Comments:     No driving for 2 weeks.  Do not take narcotics and drive.   Increase activity slowly  As directed    Lifting restrictions  As directed    Comments:     No lifting greater than 10 lbs.   Sexual Activity Restrictions  As directed    Comments:     No sexual activity, nothing in the vagina, for 6 weeks.       Medication List         BYDUREON 2 MG Pen  Generic drug:  Exenatide ER  Inject into the skin.     cetirizine 10 MG tablet  Commonly known as:  ZYRTEC  Take 10 mg by mouth daily.     HAIR/SKIN/NAILS PO  Take 1 tablet by mouth 2 (two) times daily.     ALIVE WOMENS ENERGY PO  Take 1 tablet by mouth daily.     lisinopril 5 MG tablet  Commonly known as:  PRINIVIL,ZESTRIL  Take 5 mg by mouth every morning.     multivitamin with minerals Tabs tablet  Take 1 tablet by mouth daily.     oxyCODONE-acetaminophen 5-325  MG per tablet  Commonly known as:  PERCOCET/ROXICET  Take 1-2 tablets by mouth every 4 (four) hours as needed (moderate to severe pain).     pioglitazone 15 MG tablet  Commonly known as:  ACTOS  Take 15 mg by mouth every morning.     simvastatin 20 MG tablet  Commonly known as:  ZOCOR  Take 20 mg by mouth every morning.           Follow-up Information   Follow up with Laurette Schimke, MD On 07/30/2013. (at 10:30am at [facility])    Specialty:  Obstetrics and Gynecology   Contact information:   [address] [place] [address] [phone]       Greater than thirty minutes were spend for face to  face discharge instructions and discharge orders/summary in EPIC.   Signed: Schneur Crowson DEAL 07/04/2013, 8:40 AM

## 2013-07-04 NOTE — Discharge Instructions (Signed)
07/04/2013  Return to work: 4-6 weeks  Activity: 1. Be up and out of the bed during the day.  Take a nap if needed.  You may walk up steps but be careful and use the hand rail.  Stair climbing will tire you more than you think, you may need to stop part way and rest.   2. No lifting or straining for 6 weeks.  3. No driving for 2 weeks.  Do not drive if you are taking narcotic pain medicine.  4. Shower daily.  Use soap and water on your incision and pat dry; don't rub.   5. No sexual activity and nothing in the vagina for 6 weeks.  Diet: 1. Low sodium Heart Healthy Diet is recommended.  2. It is safe to use a laxative if you have difficulty moving your bowels.   Wound Care: 1. Keep clean and dry.  Shower daily.  Reasons to call the Doctor:  Fever - Oral temperature greater than 100.4 degrees Fahrenheit  Foul-smelling vaginal discharge  Difficulty urinating  Nausea and vomiting  Increased pain at the site of the incision that is unrelieved with pain medicine.  Difficulty breathing with or without chest pain  New calf pain especially if only on one side  Sudden, continuing increased vaginal bleeding with or without clots.   Contacts: For questions or concerns you should contact:  Dr. Antionette Char at [phone]  Dr. Nelly Rout at [facility] [phone]  Warner Mccreedy, NP at [phone]  Acetaminophen; Oxycodone tablets What is this medicine? ACETAMINOPHEN; OXYCODONE (a set a MEE noe fen; ox i KOE done) is a pain reliever. It is used to treat mild to moderate pain. This medicine may be used for other purposes; ask your health care provider or pharmacist if you have questions. What should I tell my health care provider before I take this medicine? They need to know if you have any of these conditions: -brain tumor -Crohn's disease, inflammatory bowel disease, or ulcerative colitis -drink more than 3 alcohol containing drinks per day -drug abuse or  addiction -head injury -heart or circulation problems -kidney disease or problems going to the bathroom -liver disease -lung disease, asthma, or breathing problems -an unusual or allergic reaction to acetaminophen, oxycodone, other opioid analgesics, other medicines, foods, dyes, or preservatives -pregnant or trying to get pregnant -breast-feeding How should I use this medicine? Take this medicine by mouth with a full glass of water. Follow the directions on the prescription label. Take your medicine at regular intervals. Do not take your medicine more often than directed. Talk to your pediatrician regarding the use of this medicine in children. Special care may be needed. Patients over 85 years old may have a stronger reaction and need a smaller dose. Overdosage: If you think you have taken too much of this medicine contact a poison control center or emergency room at once. NOTE: This medicine is only for you. Do not share this medicine with others. What if I miss a dose? If you miss a dose, take it as soon as you can. If it is almost time for your next dose, take only that dose. Do not take double or extra doses. What may interact with this medicine? -alcohol -antihistamines -barbiturates like amobarbital, butalbital, butabarbital, methohexital, pentobarbital, phenobarbital, thiopental, and secobarbital -benztropine -drugs for bladder problems like solifenacin, trospium, oxybutynin, tolterodine, hyoscyamine, and methscopolamine -drugs for breathing problems like ipratropium and tiotropium -drugs for certain stomach or intestine problems like propantheline, homatropine methylbromide, glycopyrrolate, atropine, belladonna, and  dicyclomine -general anesthetics like etomidate, ketamine, nitrous oxide, propofol, desflurane, enflurane, halothane, isoflurane, and sevoflurane -medicines for depression, anxiety, or psychotic disturbances -medicines for sleep -muscle  relaxants -naltrexone -narcotic medicines (opiates) for pain -phenothiazines like perphenazine, thioridazine, chlorpromazine, mesoridazine, fluphenazine, prochlorperazine, promazine, and trifluoperazine -scopolamine -tramadol -trihexyphenidyl This list may not describe all possible interactions. Give your health care provider a list of all the medicines, herbs, non-prescription drugs, or dietary supplements you use. Also tell them if you smoke, drink alcohol, or use illegal drugs. Some items may interact with your medicine. What should I watch for while using this medicine? Tell your doctor or health care professional if your pain does not go away, if it gets worse, or if you have new or a different type of pain. You may develop tolerance to the medicine. Tolerance means that you will need a higher dose of the medication for pain relief. Tolerance is normal and is expected if you take this medicine for a long time. Do not suddenly stop taking your medicine because you may develop a severe reaction. Your body becomes used to the medicine. This does NOT mean you are addicted. Addiction is a behavior related to getting and using a drug for a non-medical reason. If you have pain, you have a medical reason to take pain medicine. Your doctor will tell you how much medicine to take. If your doctor wants you to stop the medicine, the dose will be slowly lowered over time to avoid any side effects. You may get drowsy or dizzy. Do not drive, use machinery, or do anything that needs mental alertness until you know how this medicine affects you. Do not stand or sit up quickly, especially if you are an older patient. This reduces the risk of dizzy or fainting spells. Alcohol may interfere with the effect of this medicine. Avoid alcoholic drinks. There are different types of narcotic medicines (opiates) for pain. If you take more than one type at the same time, you may have more side effects. Give your health care  provider a list of all medicines you use. Your doctor will tell you how much medicine to take. Do not take more medicine than directed. Call emergency for help if you have problems breathing. The medicine will cause constipation. Try to have a bowel movement at least every 2 to 3 days. If you do not have a bowel movement for 3 days, call your doctor or health care professional. Do not take Tylenol (acetaminophen) or medicines that have acetaminophen with this medicine. Too much acetaminophen can be very dangerous. Many nonprescription medicines contain acetaminophen. Always read the labels carefully to avoid taking more acetaminophen. What side effects may I notice from receiving this medicine? Side effects that you should report to your doctor or health care professional as soon as possible: -allergic reactions like skin rash, itching or hives, swelling of the face, lips, or tongue -breathing difficulties, wheezing -confusion -light headedness or fainting spells -severe stomach pain -yellowing of the skin or the whites of the eyes Side effects that usually do not require medical attention (report to your doctor or health care professional if they continue or are bothersome): -dizziness -drowsiness -nausea -vomiting This list may not describe all possible side effects. Call your doctor for medical advice about side effects. You may report side effects to FDA at 1-800-FDA-1088. Where should I keep my medicine? Keep out of the reach of children. This medicine can be abused. Keep your medicine in a safe place to protect  it from theft. Do not share this medicine with anyone. Selling or giving away this medicine is dangerous and against the law. Store at room temperature between 20 and 25 degrees C (68 and 77 degrees F). Keep container tightly closed. Protect from light.  Discard unused medicine and used packaging carefully. Pets and children can be harmed if they find used or lost packages. Flush any  unused medicines down the toilet. Do not use the medicine after the expiration date. NOTE: This sheet is a summary. It may not cover all possible information. If you have questions about this medicine, talk to your doctor, pharmacist, or health care provider.  2013, [employer]. (06/21/2011 4:13:41 PM)

## 2013-07-05 NOTE — Anesthesia Postprocedure Evaluation (Signed)
Anesthesia Post Note  Patient: Lisa Baldwin  Procedure(s) Performed: Procedure(s) (LRB): ROBOTIC ASSISTED LEFT SALPINGO OOPHORECTOMY CONVERTED TO OPEN,AND RIGHT SALPINJECTOMY (N/A)  Anesthesia type: General  Patient location: PACU  Post pain: Pain level controlled  Post assessment: Post-op Vital signs reviewed  Last Vitals:  Filed Vitals:   07/04/13 0608  BP: 104/70  Pulse: 101  Temp: 37.1 C  Resp: 18    Post vital signs: Reviewed  Level of consciousness: sedated  Complications: No apparent anesthesia complications

## 2013-07-08 ENCOUNTER — Other Ambulatory Visit: Payer: Self-pay | Admitting: Gynecologic Oncology

## 2013-07-08 DIAGNOSIS — Z9889 Other specified postprocedural states: Secondary | ICD-10-CM

## 2013-07-08 DIAGNOSIS — R110 Nausea: Secondary | ICD-10-CM

## 2013-07-08 MED ORDER — ONDANSETRON HCL 4 MG PO TABS: 4.0000 mg | ORAL_TABLET | Freq: Three times a day (TID) | ORAL | Status: DC | PRN

## 2013-07-08 NOTE — Progress Notes (Signed)
Patient called with complaints of nausea since yesterday am.  "I can't keep anything down."  Having bowel movements and passing flatus.  No vaginal bleeding or severe abdominal pain reported.  She states that she ate a bowl of cereal yesterday am and took her am meds along with pain medication and has been nauseated since.  Instructed that zofran would be sent to her pharmacy to take every eight hours as needed for nausea.  Reportable signs and symptoms reviewed.  She is to contact the office with an update if nausea persists or worsens over the next day.

## 2013-07-29 ENCOUNTER — Telehealth: Payer: Self-pay | Admitting: Gynecologic Oncology

## 2013-07-29 NOTE — Telephone Encounter (Signed)
Post Op  Note: Gyn-Onc  Lisa Baldwin 52 y.o. female  CC: Benign ovarian mass.  Post op check.  HPI: Patient is a very pleasant 52 year old gravida 2 para 2 who underwent hysterectomy 2007 secondary to uterine fibroids and pelvic pain. This was done via a Pfannenstiel skin incision by Dr. Henderson Cloud. 2-3 weeks ago she began experiencing some left lower abdominal and side pain. She went to see her primary physician. An ultrasound August 20 revealed a 6.3 x 5.7 x 6.1 cm cystic and solid ovarian mass. She was seen by him on August 21. At that time there was a fullness and mild tenderness in the left adnexa. She underwent a CT scan on 06/19/2013 that revealed the liver, spleen, and pancreas to be normal. The appendix was normal. There is no free fluid or adenopathy within the abdomen or pelvis. The right ovary was identified and measured 3 x 2 cm it was normal. However, the left ovary measured 5.2 x 2.9 x 3.7 cm and there was a suggestion of a 3 cm mildly hypoattenuating complex mass within the left ovary. A CA 125 was drawn that was normal at 7.5.   Interval History:  On July 02, 2013 she underwent exp lap, adhesiolysis, bilateral salpingectomy, left oophorectomy.  1. Ovary and fallopian tube, left - BENIGN OVARIAN TISSUE WITH HEMORRHAGIC CORPUS LUTEAL CYST AND FOCAL ENDOSALPINGIOSIS, NO ATYPIA OR MALIGNANCY. - BENIGN FALLOPIAN TUBAL TISSUE. NO ATYPIA OR MALIGNANCY. 2. Fallopian tube, right - FALLOPIAN TUBE: COMPLETE TRANSECTION WITH NO PATHOLOGIC ABNORMALITIES. - NO ATYPIA OR MALIGNANCY.   Review of Systems: Constitutional:  Denies fever. Cardiovascular: No chest pain, shortness of breath, or edema  Pulmonary: No cough or wheeze.  Gastro Intestinal: Reporting intermittent lower abdominal soreness.  No nausea, vomiting, constipation, or diarrhea reported. No bright red blood per rectum or change in bowel movement.  Genitourinary: No frequency, urgency, or dysuria.  Denies vaginal  bleeding and discharge.  Musculoskeletal: No myalgia, arthralgia, joint swelling or pain.  Neurologic: No weakness, numbness, or change in gait.  Psychology: No changes.   Past Surgical Hx:  Past Surgical History  Procedure Laterality Date  . Tubal ligation    . Abdominal hysterectomy    . Hand surgery  2004    Carpal Tunnel  (Bilateral)  . Cesarean section      x2  . Robotic assisted salpingo oopherectomy N/A 07/02/2013    Procedure: ROBOTIC ASSISTED LEFT SALPINGO OOPHORECTOMY CONVERTED TO OPEN,AND RIGHT SALPINJECTOMY;  Surgeon: Laurette Schimke, MD;  Location: [facility];  Service: Gynecology;  Laterality: N/A;    Past Medical Hx:  Past Medical History  Diagnosis Date  . Abnormal Pap smear   . Diabetes mellitus without complication   . Hypercholesteremia     on medicine to prevent   . GERD (gastroesophageal reflux disease)     occasional  . Anemia     younger    Oncology Hx:   No history exists.    Family Hx:  Family History  Problem Relation Age of Onset  . Cancer Sister 23  . Cancer Maternal Grandmother 34  . Cancer - Other Maternal Aunt 9  . Cancer - Other Maternal Uncle 42  . Cancer - Prostate Maternal Uncle 82  . Cancer - Lung Maternal Uncle 54    Vitals:  Blood pressure 106/70, pulse 98, temperature 99 F (37.2 C), temperature source Oral, resp. rate 16, height 4' 11.06" (1.5 m), weight 143 lb 11.2 oz (65.182 kg).  Physical Exam:  Well-nourished well-developed female in no acute distress.  Neck: Supple, no lymphadenopathy, no thyromegaly.  Lungs: Clear to auscultation bilaterally.  Cardiovascular: Regular rate and rhythm.  Abdomen: Well-healed right upper quadrant incision. Infraumbilical incision and Pfannenstiel incision is x3. Abdomen is soft, nontender, nondistended. There is no fluid wave. There is no hepatosplenomegaly. There is no obvious masses.  Groins: No lymphadenopathy.  Extremities: No edema.  Pelvic: External genitalia within normal  limits. Bimanual examination reveals a midline abdominal pelvic fullness. There is no obvious masses appreciated. There is no nodularity.  Assessment/Plan:  52 year old with a complex left adnexal mass,Benign disease. F/U with Dr. Reita May, [facility], MD 07/29/2013, 4:36 PM

## 2013-07-30 ENCOUNTER — Ambulatory Visit: Payer: 59 | Attending: Gynecologic Oncology | Admitting: Gynecologic Oncology

## 2013-07-30 ENCOUNTER — Encounter: Payer: Self-pay | Admitting: Gynecologic Oncology

## 2013-07-30 VITALS — BP 105/63 | HR 98 | Temp 99.3°F | Resp 16 | Ht 59.06 in | Wt 136.7 lb

## 2013-07-30 DIAGNOSIS — N83209 Unspecified ovarian cyst, unspecified side: Secondary | ICD-10-CM

## 2013-07-30 DIAGNOSIS — N831 Corpus luteum cyst of ovary, unspecified side: Secondary | ICD-10-CM | POA: Insufficient documentation

## 2013-07-30 DIAGNOSIS — Z9079 Acquired absence of other genital organ(s): Secondary | ICD-10-CM | POA: Insufficient documentation

## 2013-07-30 DIAGNOSIS — K219 Gastro-esophageal reflux disease without esophagitis: Secondary | ICD-10-CM | POA: Insufficient documentation

## 2013-07-30 DIAGNOSIS — Z90710 Acquired absence of both cervix and uterus: Secondary | ICD-10-CM | POA: Insufficient documentation

## 2013-07-30 DIAGNOSIS — E7800 Pure hypercholesterolemia, unspecified: Secondary | ICD-10-CM | POA: Insufficient documentation

## 2013-07-30 DIAGNOSIS — E119 Type 2 diabetes mellitus without complications: Secondary | ICD-10-CM | POA: Insufficient documentation

## 2013-07-30 NOTE — Progress Notes (Signed)
Post Op  Note: Gyn-Onc  Lisa Baldwin 52 y.o. female  CC: Benign ovarian mass.  Post op check.  HPI: Patient is a very pleasant 52 year old gravida 2 para 2 who underwent hysterectomy 2007 secondary to uterine fibroids and pelvic pain. This was done via a Pfannenstiel skin incision by Dr. Henderson Cloud. 2-3 weeks ago she began experiencing some left lower abdominal and side pain. She went to see her primary physician. An ultrasound August 20 revealed a 6.3 x 5.7 x 6.1 cm cystic and solid ovarian mass. She was seen by him on August 21. At that time there was a fullness and mild tenderness in the left adnexa. She underwent a CT scan on 06/19/2013 that revealed the liver, spleen, and pancreas to be normal. The appendix was normal. There is no free fluid or adenopathy within the abdomen or pelvis. The right ovary was identified and measured 3 x 2 cm it was normal. However, the left ovary measured 5.2 x 2.9 x 3.7 cm and there was a suggestion of a 3 cm mildly hypoattenuating complex mass within the left ovary. A CA 125 was drawn that was normal at 7.5.   Interval History:  On July 02, 2013 she underwent laparoscopy that was converted to exp lap, with extensive adhesiolysis, bilateral salpingectomy, left oophorectomy.  1. Ovary and fallopian tube, left - BENIGN OVARIAN TISSUE WITH HEMORRHAGIC CORPUS LUTEAL CYST AND FOCAL ENDOSALPINGIOSIS, NO ATYPIA OR MALIGNANCY. - BENIGN FALLOPIAN TUBAL TISSUE. NO ATYPIA OR MALIGNANCY. 2. Fallopian tube, right - FALLOPIAN TUBE: COMPLETE TRANSECTION WITH NO PATHOLOGIC ABNORMALITIES. - NO ATYPIA OR MALIGNANCY.   Review of Systems: Constitutional:  Denies fever. Cardiovascular: No chest pain, shortness of breath, or edema  Pulmonary: No cough or wheeze.  Gastro Intestinal: Reporting intermittent lower abdominal soreness.  No nausea, vomiting, constipation, or diarrhea reported. No bright red blood per rectum or change in bowel movement.  Genitourinary: No  frequency, urgency, or dysuria.  Denies vaginal bleeding and discharge.  Musculoskeletal: No myalgia, arthralgia, joint swelling or pain.  Neurologic: No weakness, numbness, or change in gait.  Psychology: No changes.   Past Surgical Hx:  Past Surgical History  Procedure Laterality Date  . Tubal ligation    . Abdominal hysterectomy    . Hand surgery  2004    Carpal Tunnel  (Bilateral)  . Cesarean section      x2  . Robotic assisted salpingo oopherectomy N/A 07/02/2013    Procedure: ROBOTIC ASSISTED LEFT SALPINGO OOPHORECTOMY CONVERTED TO OPEN,AND RIGHT SALPINJECTOMY;  Surgeon: Laurette Schimke, MD;  Location: [facility];  Service: Gynecology;  Laterality: N/A;    Past Medical Hx:  Past Medical History  Diagnosis Date  . Abnormal Pap smear   . Diabetes mellitus without complication   . Hypercholesteremia     on medicine to prevent   . GERD (gastroesophageal reflux disease)     occasional  . Anemia     younger    Oncology Hx:   No history exists.    Family Hx:  Family History  Problem Relation Age of Onset  . Cancer Sister 41  . Cancer Maternal Grandmother 49  . Cancer - Other Maternal Aunt 40  . Cancer - Other Maternal Uncle 68  . Cancer - Prostate Maternal Uncle 82  . Cancer - Lung Maternal Uncle 27    Vitals:  Blood pressure 105/63, pulse 98, temperature 99.3 F (37.4 C), temperature source Oral, resp. rate 16, height 4' 11.06" (1.5 m), weight 136 lb 11.2  oz (62.007 kg).  Physical Exam:  Well-nourished well-developed female in no acute distress.  Lungs: Clear to auscultation bilaterally.  Cardiovascular: Regular rate and rhythm.  Abdomen: Well-healed incisions. Sutures removed from the umbilical and right lateral incisions.  Pfannenstiel incision is intact Abdomen is soft, nontender, nondistended.   Extremities: No edema.  Pelvic: External genitalia within normal limits. Cul-de-sac nodularity nontender  Assessment/Plan:  52 year old with a complex left  adnexal mass.  Status post bilateral salpingectomy and left oophorectomy . Benign disease. F/U with Dr. Reita May, [facility], MD 07/30/2013, 10:40 AM

## 2013-07-30 NOTE — Patient Instructions (Signed)
F/U with Dr. Henderson Cloud in 6 months.   Diagnosis 1. Ovary and fallopian tube, left - BENIGN OVARIAN TISSUE WITH HEMORRHAGIC CORPUS LUTEAL CYST AND FOCAL ENDOSALPINGIOSIS, NO ATYPIA OR MALIGNANCY. - BENIGN FALLOPIAN TUBAL TISSUE. NO ATYPIA OR MALIGNANCY. 2. Fallopian tube, right - FALLOPIAN TUBE: COMPLETE TRANSECTION WITH NO PATHOLOGIC ABNORMALITIES. - NO ATYPIA OR MALIGNANCY.  Intraoperative Diagnosis LEFT OVARY, FROZEN SECTION DIAGNOSIS: HEMORRHAGIC CYST (HCL) Specimen Gross and Clinical Information Specimen(s) Obtained: 1. Ovary and fallopian tube, left 2. Fallopian tube, right   Thank you very much Ms. BELLE CHARLIE for allowing me to provide care for you today.  I appreciate your confidence in choosing our Gynecologic Oncology team.  If you have any questions about your visit today please call our office and we will get back to you as soon as possible.  Maryclare Labrador. Nalu Troublefield MD., PhD Gynecologic Oncology

## 2014-03-24 ENCOUNTER — Other Ambulatory Visit: Payer: Self-pay

## 2014-03-24 DIAGNOSIS — Z1231 Encounter for screening mammogram for malignant neoplasm of breast: Secondary | ICD-10-CM

## 2014-04-07 ENCOUNTER — Ambulatory Visit
Admission: RE | Admit: 2014-04-07 | Discharge: 2014-04-07 | Disposition: A | Discharge status: Home / Self Care | Payer: 59 | Source: Ambulatory Visit

## 2014-04-07 DIAGNOSIS — Z1231 Encounter for screening mammogram for malignant neoplasm of breast: Secondary | ICD-10-CM

## 2014-07-13 ENCOUNTER — Emergency Department (HOSPITAL_COMMUNITY)
Admission: EM | Admit: 2014-07-13 | Discharge: 2014-07-13 | Disposition: A | Discharge status: Home / Self Care | Payer: 59 | Attending: Emergency Medicine | Admitting: Emergency Medicine

## 2014-07-13 ENCOUNTER — Encounter (HOSPITAL_COMMUNITY): Payer: Self-pay | Admitting: Emergency Medicine

## 2014-07-13 DIAGNOSIS — Z8719 Personal history of other diseases of the digestive system: Secondary | ICD-10-CM | POA: Insufficient documentation

## 2014-07-13 DIAGNOSIS — R252 Cramp and spasm: Secondary | ICD-10-CM | POA: Insufficient documentation

## 2014-07-13 DIAGNOSIS — M25559 Pain in unspecified hip: Secondary | ICD-10-CM | POA: Diagnosis not present

## 2014-07-13 DIAGNOSIS — E7800 Pure hypercholesterolemia, unspecified: Secondary | ICD-10-CM | POA: Insufficient documentation

## 2014-07-13 DIAGNOSIS — M25551 Pain in right hip: Secondary | ICD-10-CM

## 2014-07-13 DIAGNOSIS — M79609 Pain in unspecified limb: Secondary | ICD-10-CM | POA: Diagnosis not present

## 2014-07-13 DIAGNOSIS — Z862 Personal history of diseases of the blood and blood-forming organs and certain disorders involving the immune mechanism: Secondary | ICD-10-CM | POA: Diagnosis not present

## 2014-07-13 DIAGNOSIS — Z79899 Other long term (current) drug therapy: Secondary | ICD-10-CM | POA: Diagnosis not present

## 2014-07-13 LAB — BASIC METABOLIC PANEL
ANION GAP: 12 (ref 5–15)
BUN: 12 mg/dL (ref 6–23)
CHLORIDE: 104 meq/L (ref 96–112)
CO2: 22 mEq/L (ref 19–32)
Calcium: 9.2 mg/dL (ref 8.4–10.5)
Creatinine, Ser: 0.49 mg/dL — ABNORMAL LOW (ref 0.50–1.10)
Glucose, Bld: 104 mg/dL — ABNORMAL HIGH (ref 70–99)
POTASSIUM: 3.9 meq/L (ref 3.7–5.3)
SODIUM: 138 meq/L (ref 137–147)

## 2014-07-13 LAB — CBC
HCT: 35.9 % — ABNORMAL LOW (ref 36.0–46.0)
Hemoglobin: 11.9 g/dL — ABNORMAL LOW (ref 12.0–15.0)
MCH: 30.4 pg (ref 26.0–34.0)
MCHC: 33.1 g/dL (ref 30.0–36.0)
MCV: 91.6 fL (ref 78.0–100.0)
PLATELETS: 256 10*3/uL (ref 150–400)
RBC: 3.92 MIL/uL (ref 3.87–5.11)
RDW: 12.7 % (ref 11.5–15.5)
WBC: 6.3 10*3/uL (ref 4.0–10.5)

## 2014-07-13 LAB — CBG MONITORING, ED: GLUCOSE-CAPILLARY: 102 mg/dL — AB (ref 70–99)

## 2014-07-13 MED ORDER — HYDROCODONE-ACETAMINOPHEN 5-325 MG PO TABS: 1.0000 | ORAL_TABLET | ORAL | Status: AC | PRN

## 2014-07-13 MED ORDER — DIAZEPAM 5 MG PO TABS
10.0000 mg | ORAL_TABLET | Freq: Once | ORAL | Status: AC
  Administered 2014-07-13: 10 mg via ORAL
  Filled 2014-07-13: qty 2

## 2014-07-13 MED ORDER — DIAZEPAM 5 MG PO TABS: 5.0000 mg | ORAL_TABLET | Freq: Four times a day (QID) | ORAL | Status: AC | PRN

## 2014-07-13 MED ORDER — HYDROCODONE-ACETAMINOPHEN 5-325 MG PO TABS
2.0000 | ORAL_TABLET | Freq: Once | ORAL | Status: AC
  Administered 2014-07-13: 2 via ORAL
  Filled 2014-07-13: qty 2

## 2014-07-13 NOTE — ED Notes (Signed)
CBG 102 

## 2014-07-13 NOTE — ED Provider Notes (Signed)
CSN: [id number]     Arrival date & time 07/13/14  1759 History   First MD Initiated Contact with Patient 07/13/14 1820     Chief Complaint  Patient presents with  . leg cramp      (Consider location/radiation/quality/duration/timing/severity/associated sxs/prior Treatment) The history is provided by the patient and medical records. No language interpreter was used.    Lisa Baldwin is a 53 y.o. female  with a hx of NIDDM, GERD, anemia presents to the Emergency Department complaining of waxing and waning, persistent, progressively worsening right leg cramps onset this morning. Pt reports initially the cramps were only in her foot, but since 1PM they have been in her anterior medial right thigh.  She reports no radiation of the pain into her groin.  Associated symptoms include pain with ambulation and movement of the leg.  No treatment prior to arrival. No alleviating factors. Patient denies fever, chills, headache, neck pain, chest pain, shortness of breath, abdominal pain, nausea, vomiting, diarrhea, weakness, dizziness, syncope, dysuria, hematuria.  Patient also denies recent immobilization, surgeries, fractures, history of DVT or PE.  Past Medical History  Diagnosis Date  . Abnormal Pap smear   . Diabetes mellitus without complication   . Hypercholesteremia     on medicine to prevent   . GERD (gastroesophageal reflux disease)     occasional  . Anemia     younger   Past Surgical History  Procedure Laterality Date  . Tubal ligation    . Abdominal hysterectomy    . Hand surgery  2004    Carpal Tunnel  (Bilateral)  . Cesarean section      x2  . Robotic assisted salpingo oopherectomy N/A 07/02/2013    Procedure: ROBOTIC ASSISTED LEFT SALPINGO OOPHORECTOMY CONVERTED TO OPEN,AND RIGHT SALPINJECTOMY;  Surgeon: Janie Morning, MD;  Location: [facility];  Service: Gynecology;  Laterality: N/A;   Family History  Problem Relation Age of Onset  . Cancer Sister 31  . Cancer Maternal  Grandmother 57  . Cancer - Other Maternal Aunt 77  . Cancer - Other Maternal Uncle 42  . Cancer - Prostate Maternal Uncle 82  . Cancer - Lung Maternal Uncle 44   History  Substance Use Topics  . Smoking status: Never Smoker   . Smokeless tobacco: Never Used  . Alcohol Use: Yes     Comment: occasional   OB History   Grav Para Term Preterm Abortions TAB SAB Ect Mult Living                 Review of Systems  Constitutional: Negative for fever, chills, diaphoresis, appetite change, fatigue and unexpected weight change.  HENT: Negative for mouth sores.   Eyes: Negative for visual disturbance.  Respiratory: Negative for cough, chest tightness, shortness of breath and wheezing.   Cardiovascular: Negative for chest pain.  Gastrointestinal: Negative for nausea, vomiting, abdominal pain, diarrhea and constipation.  Endocrine: Negative for polydipsia, polyphagia and polyuria.  Genitourinary: Negative for dysuria, urgency, frequency and hematuria.  Musculoskeletal: Positive for arthralgias and joint swelling. Negative for back pain, neck pain and neck stiffness.  Skin: Negative for rash and wound.  Allergic/Immunologic: Negative for immunocompromised state.  Neurological: Negative for syncope, light-headedness, numbness and headaches.  Hematological: Does not bruise/bleed easily.  Psychiatric/Behavioral: Negative for sleep disturbance. The patient is not nervous/anxious.   All other systems reviewed and are negative.     Allergies  Review of patient's allergies indicates no known allergies.  Home Medications   Prior  to Admission medications   Medication Sig Start Date End Date Taking? Authorizing Provider  cetirizine (ZYRTEC) 10 MG tablet Take 10 mg by mouth daily.   Yes Historical Provider, MD  Exenatide ER (BYDUREON) 2 MG PEN Inject into the skin once a week. Every Monday.   Yes Historical Provider, MD  lisinopril (PRINIVIL,ZESTRIL) 5 MG tablet Take 5 mg by mouth every morning.    Yes Historical Provider, MD  Multiple Vitamin (MULTIVITAMIN WITH MINERALS) TABS tablet Take 1 tablet by mouth daily.   Yes Historical Provider, MD  Multiple Vitamins-Minerals (HAIR/SKIN/NAILS PO) Take 1 tablet by mouth 2 (two) times daily.   Yes Historical Provider, MD  pioglitazone (ACTOS) 15 MG tablet Take 15 mg by mouth every morning.    Yes Historical Provider, MD  simvastatin (ZOCOR) 20 MG tablet Take 20 mg by mouth every morning.    Yes Historical Provider, MD  diazepam (VALIUM) 5 MG tablet Take 1 tablet (5 mg total) by mouth every 6 (six) hours as needed for anxiety (spasms). 07/13/14   Fabiana Dromgoole, PA-C  HYDROcodone-acetaminophen (NORCO/VICODIN) 5-325 MG per tablet Take 1 tablet by mouth every 4 (four) hours as needed for moderate pain or severe pain. 07/13/14   Rosali Augello, PA-C   BP 116/69  Pulse 93  Temp(Src) 98.4 F (36.9 C) (Oral)  Resp 16  SpO2 100% Physical Exam  Nursing note and vitals reviewed. Constitutional: She appears well-developed and well-nourished. No distress.  Awake, alert, nontoxic appearance  HENT:  Head: Normocephalic and atraumatic.  Mouth/Throat: Oropharynx is clear and moist. No oropharyngeal exudate.  Eyes: Conjunctivae are normal. No scleral icterus.  Neck: Normal range of motion. Neck supple.  Cardiovascular: Normal rate, regular rhythm, normal heart sounds and intact distal pulses.   No murmur heard. Capillary refill less than 3 seconds  Pulmonary/Chest: Effort normal and breath sounds normal. No respiratory distress. She has no wheezes.  Equal chest expansion  Abdominal: Soft. Bowel sounds are normal. She exhibits no mass. There is no tenderness. There is no rebound and no guarding.  Musculoskeletal: Normal range of motion. She exhibits tenderness. She exhibits no edema.  ROM: Full range of motion of the right toes, right ankle; somewhat decreased range of motion of the right knee and right hip secondary to pain Patient to stand and  ambulate with pain and antalgic gait but no foot drop or dragging of the leg No peripheral edema, negative Homans sign, no calf tenderness, no palpable cord  Neurological: She is alert. Coordination normal.  Sensation intact to dull and sharp Strength 3/5 in the right lower extremity secondary to pain  Skin: Skin is warm and dry. She is not diaphoretic. No erythema.  No tenting of the skin  Psychiatric: She has a normal mood and affect.    ED Course  Procedures (including critical care time) Labs Review Labs Reviewed  CBC - Abnormal; Notable for the following:    Hemoglobin 11.9 (*)    HCT 35.9 (*)    All other components within normal limits  BASIC METABOLIC PANEL - Abnormal; Notable for the following:    Glucose, Bld 104 (*)    Creatinine, Ser 0.49 (*)    All other components within normal limits  CBG MONITORING, ED - Abnormal; Notable for the following:    Glucose-Capillary 102 (*)    All other components within normal limits    Imaging Review No results found.   EKG Interpretation None      MDM   Final  diagnoses:  Arthralgia of right thigh   Tyreanna E Maffeo presents with right leg cramp waxing and waning throughout the day.  No risk factors for DVT, but will check electrolytes and obtain venous duplex.  No evidence of cellulitis or shingles.  7:52 PM Negative for DVT.    8:55 PM Patient with persistent pain and right thigh.  Patient is able to ambulate with pain but without weakness in her leg.  No groin pain or pain over the greater trochanter. Doubt bursitis or arthritis.  Patient given pain control and muscle relaxer here in the emergency department. Recommend followup with her primary care physician within 2 days. Patient is to return to the emergency department for swelling in the leg, rash, worsening pain or other concerning symptoms.  BP 116/69  Pulse 93  Temp(Src) 98.4 F (36.9 C) (Oral)  Resp 16  SpO2 100%   Abigail Butts, PA-C 07/14/14  0154

## 2014-07-13 NOTE — Discharge Instructions (Signed)
1. Medications: Vicodin, valium, usual home medications 2. Treatment: rest, drink plenty of fluids,  3. Follow Up: Please followup with your primary doctor in 3 days for discussion of your diagnoses and further evaluation after today's visit; if you do not have a primary care doctor use the resource guide provided to find one;     Arthralgia Your caregiver has diagnosed you as suffering from an arthralgia. Arthralgia means there is pain in a joint. This can come from many reasons including:  Bruising the joint which causes soreness (inflammation) in the joint.  Wear and tear on the joints which occur as we grow older (osteoarthritis).  Overusing the joint.  Various forms of arthritis.  Infections of the joint. Regardless of the cause of pain in your joint, most of these different pains respond to anti-inflammatory drugs and rest. The exception to this is when a joint is infected, and these cases are treated with antibiotics, if it is a bacterial infection. HOME CARE INSTRUCTIONS   Rest the injured area for as long as directed by your caregiver. Then slowly start using the joint as directed by your caregiver and as the pain allows. Crutches as directed may be useful if the ankles, knees or hips are involved. If the knee was splinted or casted, continue use and care as directed. If an stretchy or elastic wrapping bandage has been applied today, it should be removed and re-applied every 3 to 4 hours. It should not be applied tightly, but firmly enough to keep swelling down. Watch toes and feet for swelling, bluish discoloration, coldness, numbness or excessive pain. If any of these problems (symptoms) occur, remove the ace bandage and re-apply more loosely. If these symptoms persist, contact your caregiver or return to this location.  For the first 24 hours, keep the injured extremity elevated on pillows while lying down.  Apply ice for 15-20 minutes to the sore joint every couple hours while  awake for the first half day. Then 03-04 times per day for the first 48 hours. Put the ice in a plastic bag and place a towel between the bag of ice and your skin.  Wear any splinting, casting, elastic bandage applications, or slings as instructed.  Only take over-the-counter or prescription medicines for pain, discomfort, or fever as directed by your caregiver. Do not use aspirin immediately after the injury unless instructed by your physician. Aspirin can cause increased bleeding and bruising of the tissues.  If you were given crutches, continue to use them as instructed and do not resume weight bearing on the sore joint until instructed. Persistent pain and inability to use the sore joint as directed for more than 2 to 3 days are warning signs indicating that you should see a caregiver for a follow-up visit as soon as possible. Initially, a hairline fracture (break in bone) may not be evident on X-rays. Persistent pain and swelling indicate that further evaluation, non-weight bearing or use of the joint (use of crutches or slings as instructed), or further X-rays are indicated. X-rays may sometimes not show a small fracture until a week or 10 days later. Make a follow-up appointment with your own caregiver or one to whom we have referred you. A radiologist (specialist in reading X-rays) may read your X-rays. Make sure you know how you are to obtain your X-ray results. Do not assume everything is normal if you do not hear from [place]. SEEK MEDICAL CARE IF: Bruising, swelling, or pain increases. SEEK IMMEDIATE MEDICAL CARE IF:  Your fingers or toes are numb or blue.  The pain is not responding to medications and continues to stay the same or get worse.  The pain in your joint becomes severe.  You develop a fever over 102 F (38.9 C).  It becomes impossible to move or use the joint. MAKE SURE YOU:   Understand these instructions.  Will watch your condition.  Will get help right away if you  are not doing well or get worse. Document Released: 10/10/2005 Document Revised: 01/02/2012 Document Reviewed: 05/28/2008 [employer] Patient Information 2015 [place], [place]. This information is not intended to replace advice given to you by your health care provider. Make sure you discuss any questions you have with your health care provider.

## 2014-07-13 NOTE — ED Notes (Signed)
Pt c/o right leg cramps that started today that is pretty constant that started off in her foot earlier today and then subsided. No cramping is in her upper leg.

## 2014-07-13 NOTE — Progress Notes (Signed)
VASCULAR LAB PRELIMINARY  PRELIMINARY  PRELIMINARY  PRELIMINARY  Right lower extremity venous Doppler completed.    Preliminary report:  There is no DVT or SVT noted in the right lower extremity.  Alicyn Klann, RVT 07/13/2014, 7:39 PM

## 2014-07-14 NOTE — ED Provider Notes (Signed)
Medical screening examination/treatment/procedure(s) were performed by non-physician practitioner and as supervising physician I was immediately available for consultation/collaboration.  Ernestina Patches, MD 07/14/14 1309

## 2014-09-22 ENCOUNTER — Other Ambulatory Visit: Payer: Self-pay | Admitting: Obstetrics and Gynecology

## 2014-09-23 LAB — CYTOLOGY - PAP

## 2014-09-23 IMAGING — CT CT ABD-PELV W/ CM
2 of 5 series · 17 of 46 positions shown, 19 images · IV contrast (READICAT/WATER & [ID] OMNI 300)
Comparison: Ultrasound 06/12/2013

CLINICAL DATA: Constipation, with recent ultrasound suggesting
complex mass left adnexa, history of hysterectomy but the ovaries
remained

CT ABDOMEN AND PELVIS WITH CONTRAST
TECHNIQUE: Multidetector CT imaging of the abdomen and pelvis was
performed following the standard protocol during bolus
administration of intravenous contrast.
Contrast: 100mL OMNIPAQUE IOHEXOL 300 MG/ML  SOLN
BUN and creatinine were obtained on site at [HOSPITAL] at
[HOSPITAL]..
Results:  BUN 12 mg/dL,  Creatinine 0.7 mg/dL.

[Series 2: abd/pelvis with · axial · 0.68mm/px · z∈[-295,+0]mm · 14 of 69 slices shown, 16 images]
[im 5/69  soft-tissue]
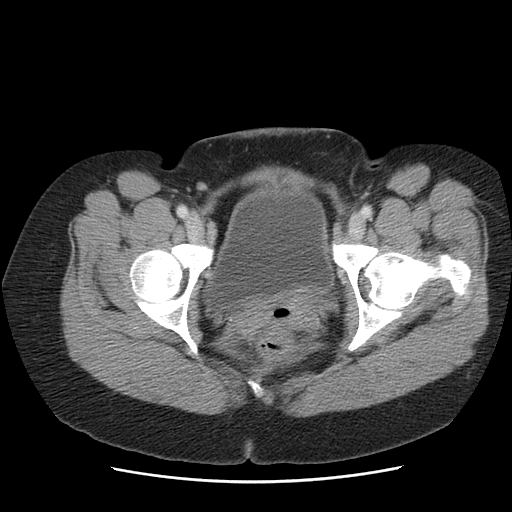
[im 5/69  bone]
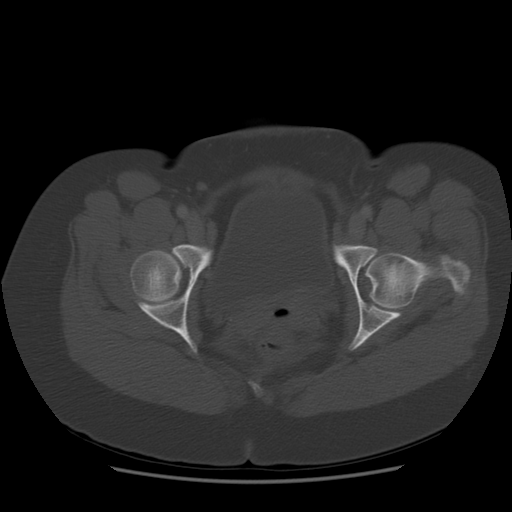
[im 9/69  soft-tissue]
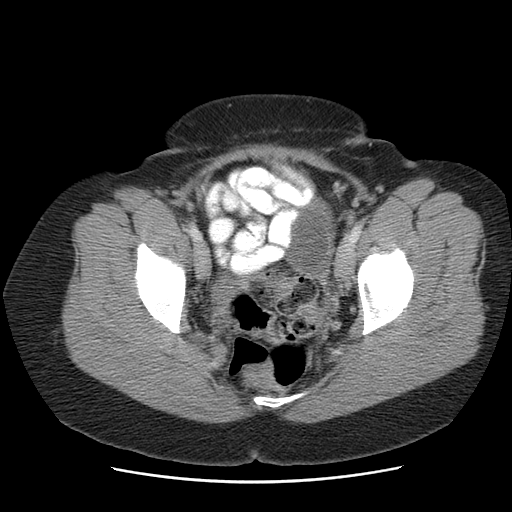
[im 13/69  soft-tissue]
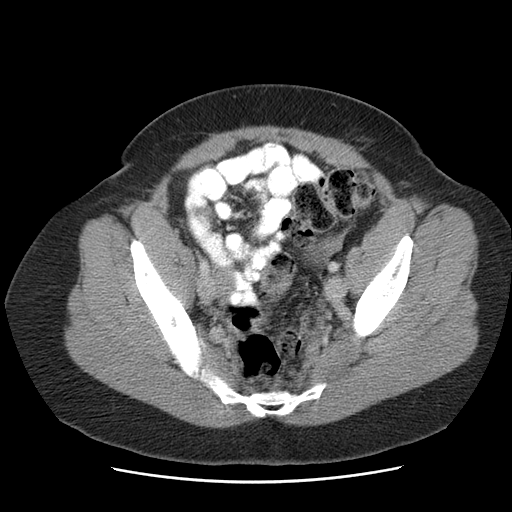
[im 18/69  soft-tissue]
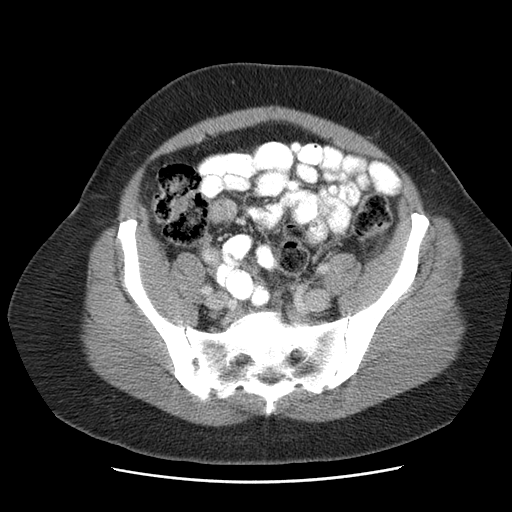
[im 22/69  soft-tissue]
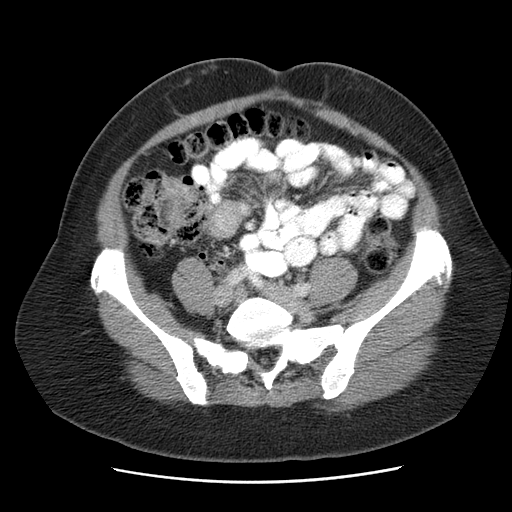
[im 26/69  soft-tissue]
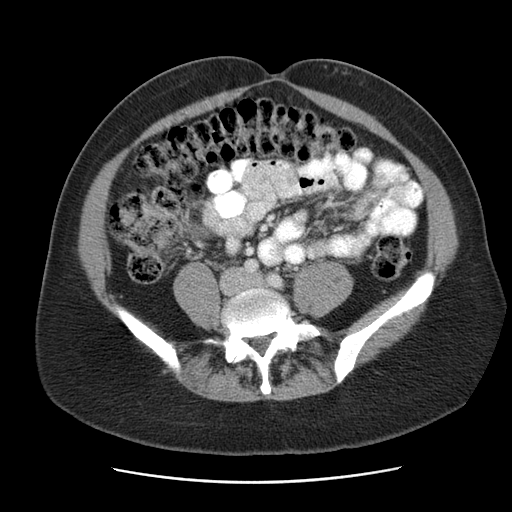
[im 30/69  soft-tissue]
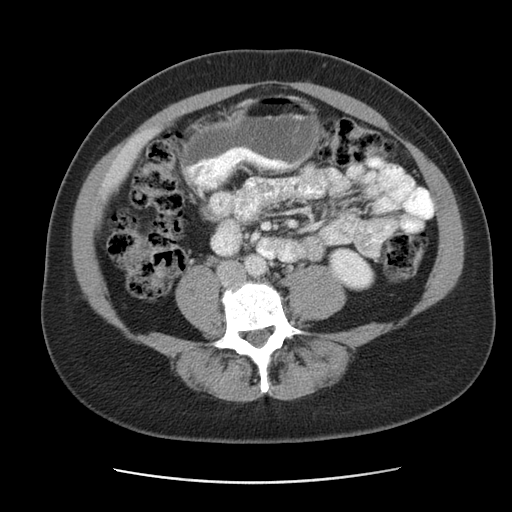
[im 39/69  soft-tissue]
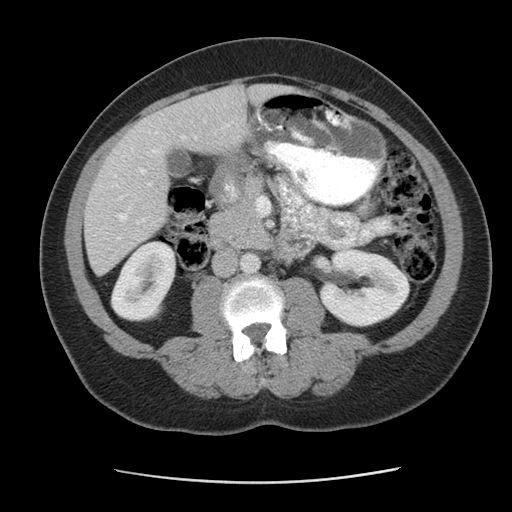
[im 43/69  soft-tissue]
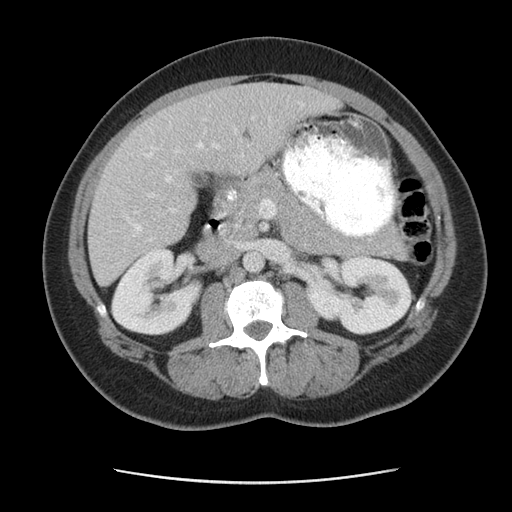
[im 43/69  bone]
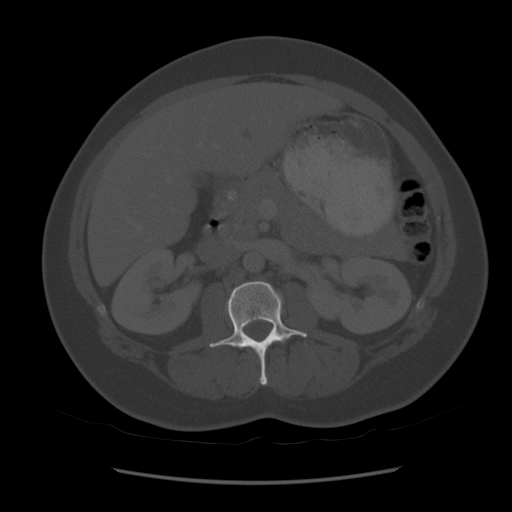
[im 47/69  soft-tissue]
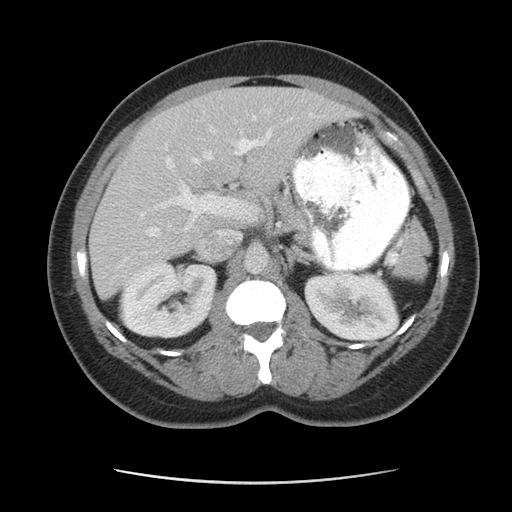
[im 52/69  soft-tissue]
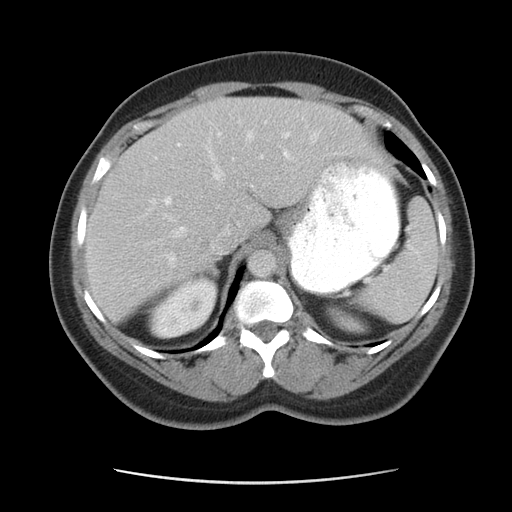
[im 56/69  soft-tissue]
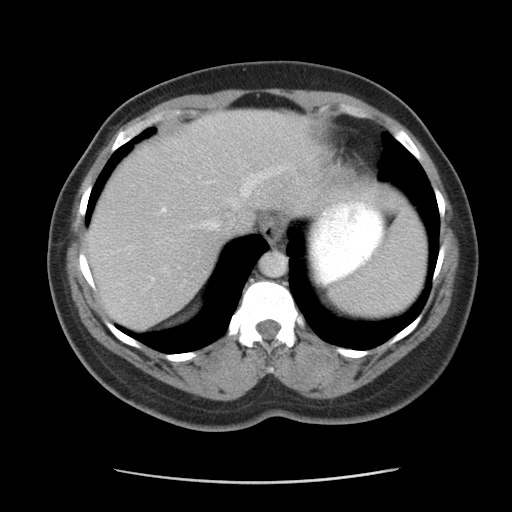
[im 60/69  soft-tissue]
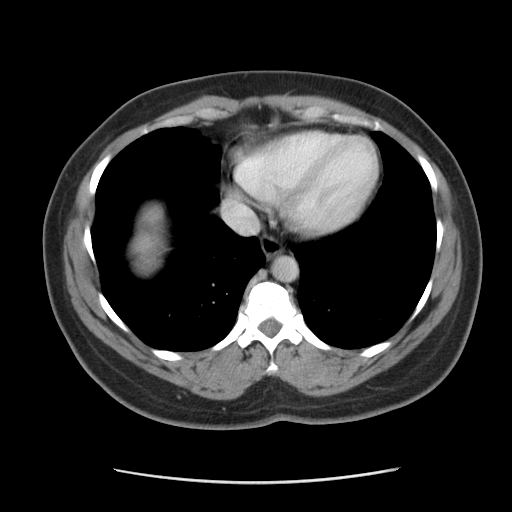
[im 64/69  soft-tissue]
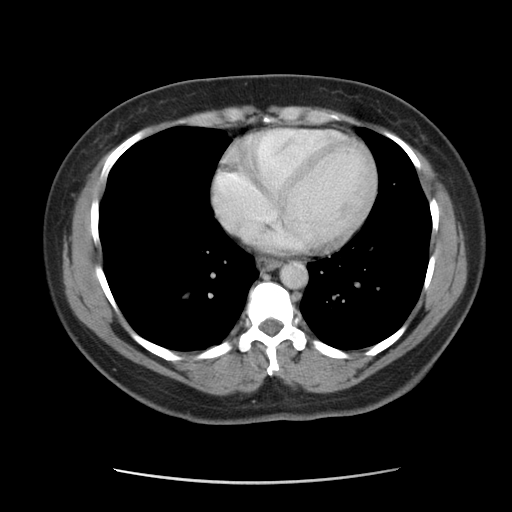

[Series 400: cor · coronal · 0.79mm/px · 3 of 134 slices shown]
[im 45/134  soft-tissue]
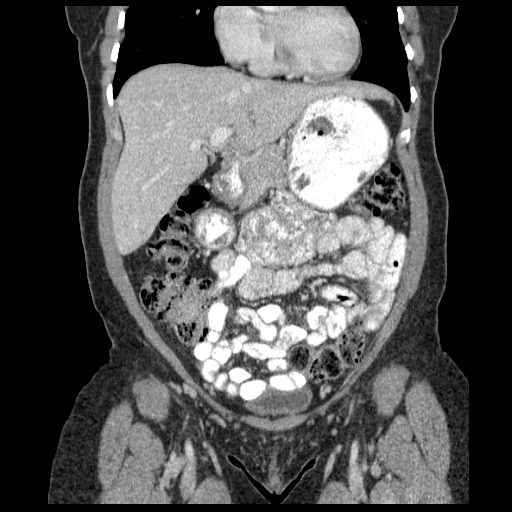
[im 60/134  soft-tissue]
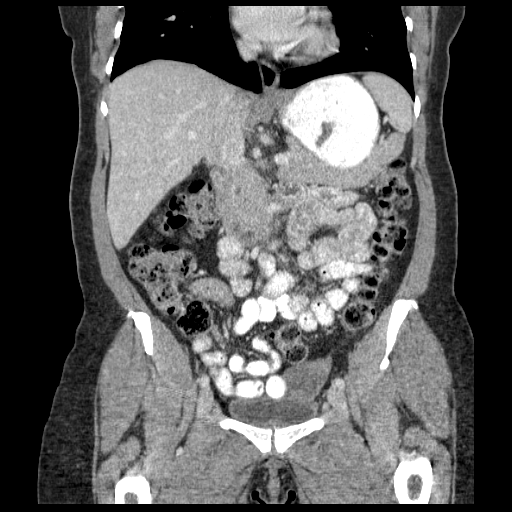
[im 74/134  soft-tissue]
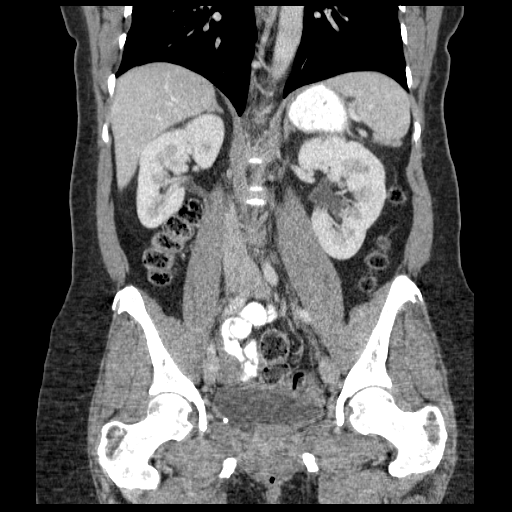

[17 of 46 positions shown; findings below may reference images not displayed]

FINDINGS: Visualized portions of the lung bases are clear.  Bone
windows are negative.

Liver, spleen, and pancreas are normal.  Adrenal glands are normal.
Kidneys are normal.  Aorta is not dilated.  There is stool
throughout the colon.  Bladder is normal.  The uterus is surgically
absent, consistent with history.  The appendix is normal.  There is
no free fluid or adenopathy in the abdomen or pelvis.  Right ovary
is identified and measures 3 x 2 cm, normal.  The left ovary
measures 52 x 29 x 37 mm.  There is a suggestion of an
approximately 3 cm mildly hypo attenuating mildly complex mass or
complex cyst within the left ovary.
IMPRESSION: There is a complex mass or cyst in the left adnexa, which, based on
relationship to the broad ligament, does appear to be of ovarian
origin.  Ovarian neoplasia is not excluded, but other possible
etiologies include hemorrhagic cyst and endometrioma.  A follow-up
ultrasound in 5-7 weeks would be suggested assuming that the mass
is not surgically removed.  If the mass does not resolve on
subsequent imaging, then surgical excision would be recommended.

## 2015-05-28 ENCOUNTER — Other Ambulatory Visit: Payer: Self-pay

## 2015-05-28 DIAGNOSIS — Z1231 Encounter for screening mammogram for malignant neoplasm of breast: Secondary | ICD-10-CM

## 2015-06-04 ENCOUNTER — Ambulatory Visit
Admission: RE | Admit: 2015-06-04 | Discharge: 2015-06-04 | Disposition: A | Discharge status: Home / Self Care | Source: Ambulatory Visit

## 2015-06-04 DIAGNOSIS — Z1231 Encounter for screening mammogram for malignant neoplasm of breast: Secondary | ICD-10-CM

## 2016-08-04 ENCOUNTER — Other Ambulatory Visit: Payer: Self-pay | Admitting: Family Medicine

## 2016-08-04 DIAGNOSIS — Z1231 Encounter for screening mammogram for malignant neoplasm of breast: Secondary | ICD-10-CM

## 2016-08-18 ENCOUNTER — Ambulatory Visit
Admission: RE | Admit: 2016-08-18 | Discharge: 2016-08-18 | Disposition: A | Discharge status: Home / Self Care | Source: Ambulatory Visit | Attending: Family Medicine | Admitting: Family Medicine

## 2016-08-18 DIAGNOSIS — Z1231 Encounter for screening mammogram for malignant neoplasm of breast: Secondary | ICD-10-CM

## 2017-07-10 ENCOUNTER — Other Ambulatory Visit: Payer: Self-pay | Admitting: Family Medicine

## 2017-07-10 DIAGNOSIS — Z1231 Encounter for screening mammogram for malignant neoplasm of breast: Secondary | ICD-10-CM

## 2017-08-21 ENCOUNTER — Inpatient Hospital Stay: Admission: RE | Admit: 2017-08-21 | Payer: 59 | Source: Ambulatory Visit

## 2017-08-24 ENCOUNTER — Ambulatory Visit
Admission: RE | Admit: 2017-08-24 | Discharge: 2017-08-24 | Disposition: A | Discharge status: Home / Self Care | Payer: 59 | Source: Ambulatory Visit | Attending: Family Medicine | Admitting: Family Medicine

## 2017-08-24 DIAGNOSIS — Z1231 Encounter for screening mammogram for malignant neoplasm of breast: Secondary | ICD-10-CM

## 2020-03-30 ENCOUNTER — Ambulatory Visit: Payer: 59 | Attending: Internal Medicine

## 2020-03-30 DIAGNOSIS — Z23 Encounter for immunization: Secondary | ICD-10-CM

## 2020-03-30 NOTE — Progress Notes (Signed)
   Covid-19 Vaccination [facility]  Name:  RONDI IVY    MRN: [id number] DOB: [birth year]  03/30/2020  Ms. Sellin was observed post Covid-19 immunization for 15 minutes without incident. She was provided with Vaccine Information Sheet and instruction to access the V-Safe system.   Ms. Florendo was instructed to call 911 with any severe reactions post vaccine: Marland Kitchen Difficulty breathing  . Swelling of face and throat  . A fast heartbeat  . A bad rash all over body  . Dizziness and weakness   Immunizations Administered    Name Date Dose VIS Date Route   Pfizer COVID-19 Vaccine 03/30/2020 11:58 AM 0.3 mL 12/18/2018 Intramuscular   Manufacturer: [place]   Lot: [id number]   [place]: [id number]
